# Patient Record
Sex: Male | Born: 1960 | Race: White | Hispanic: No | Marital: Married | Smoking: Never smoker
Health system: Southern US, Community
[De-identification: ages and names within clinical notes are randomized; demographics above are authoritative.]

## PROBLEM LIST (undated history)

## (undated) DIAGNOSIS — E782 Mixed hyperlipidemia: Secondary | ICD-10-CM

## (undated) DIAGNOSIS — I10 Essential (primary) hypertension: Secondary | ICD-10-CM

## (undated) DIAGNOSIS — Z9289 Personal history of other medical treatment: Secondary | ICD-10-CM

## (undated) DIAGNOSIS — K219 Gastro-esophageal reflux disease without esophagitis: Secondary | ICD-10-CM

## (undated) DIAGNOSIS — Z8711 Personal history of peptic ulcer disease: Secondary | ICD-10-CM

## (undated) DIAGNOSIS — N210 Calculus in bladder: Secondary | ICD-10-CM

## (undated) DIAGNOSIS — Z8719 Personal history of other diseases of the digestive system: Secondary | ICD-10-CM

## (undated) DIAGNOSIS — Z87442 Personal history of urinary calculi: Secondary | ICD-10-CM

## (undated) DIAGNOSIS — N200 Calculus of kidney: Secondary | ICD-10-CM

## (undated) DIAGNOSIS — J302 Other seasonal allergic rhinitis: Secondary | ICD-10-CM

## (undated) DIAGNOSIS — T7840XA Allergy, unspecified, initial encounter: Secondary | ICD-10-CM

## (undated) DIAGNOSIS — K2950 Unspecified chronic gastritis without bleeding: Secondary | ICD-10-CM

## (undated) HISTORY — PX: UPPER GASTROINTESTINAL ENDOSCOPY: SHX188

## (undated) HISTORY — PX: WRIST GANGLION EXCISION: SUR520

## (undated) HISTORY — DX: Gastro-esophageal reflux disease without esophagitis: K21.9

## (undated) HISTORY — DX: Allergy, unspecified, initial encounter: T78.40XA

## (undated) HISTORY — PX: COLONOSCOPY: SHX174

## (undated) HISTORY — PX: EYE SURGERY: SHX253

---
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
## 1998-11-10 HISTORY — PX: TYMPANOPLASTY: SHX33

## 2016-05-02 ENCOUNTER — Ambulatory Visit (INDEPENDENT_AMBULATORY_CARE_PROVIDER_SITE_OTHER): Payer: BC Managed Care – PPO | Admitting: Family Medicine

## 2016-05-02 ENCOUNTER — Encounter: Payer: Self-pay | Admitting: Family Medicine

## 2016-05-02 VITALS — BP 160/90 | HR 79 | Temp 98.4°F | Ht 72.0 in | Wt 241.0 lb

## 2016-05-02 DIAGNOSIS — K6289 Other specified diseases of anus and rectum: Secondary | ICD-10-CM | POA: Diagnosis not present

## 2016-05-02 DIAGNOSIS — IMO0001 Reserved for inherently not codable concepts without codable children: Secondary | ICD-10-CM

## 2016-05-02 DIAGNOSIS — Z1211 Encounter for screening for malignant neoplasm of colon: Secondary | ICD-10-CM | POA: Diagnosis not present

## 2016-05-02 DIAGNOSIS — Z0001 Encounter for general adult medical examination with abnormal findings: Secondary | ICD-10-CM | POA: Diagnosis not present

## 2016-05-02 DIAGNOSIS — E669 Obesity, unspecified: Secondary | ICD-10-CM

## 2016-05-02 DIAGNOSIS — R6889 Other general symptoms and signs: Secondary | ICD-10-CM

## 2016-05-02 DIAGNOSIS — R030 Elevated blood-pressure reading, without diagnosis of hypertension: Secondary | ICD-10-CM | POA: Diagnosis not present

## 2016-05-02 DIAGNOSIS — Z130 Encounter for screening for diseases of the blood and blood-forming organs and certain disorders involving the immune mechanism: Secondary | ICD-10-CM | POA: Diagnosis not present

## 2016-05-02 LAB — COMPREHENSIVE METABOLIC PANEL
ALT: 16 U/L (ref 0–53)
AST: 15 U/L (ref 0–37)
Albumin: 4.3 g/dL (ref 3.5–5.2)
Alkaline Phosphatase: 58 U/L (ref 39–117)
BUN: 15 mg/dL (ref 6–23)
CO2: 31 mEq/L (ref 19–32)
Calcium: 9.4 mg/dL (ref 8.4–10.5)
Chloride: 102 mEq/L (ref 96–112)
Creatinine, Ser: 0.97 mg/dL (ref 0.40–1.50)
GFR: 86.2 mL/min (ref >60.00)
Glucose, Bld: 85 mg/dL (ref 70–99)
Potassium: 4.4 mEq/L (ref 3.5–5.1)
Sodium: 139 mEq/L (ref 135–145)
Total Bilirubin: 0.4 mg/dL (ref 0.2–1.2)
Total Protein: 7.1 g/dL (ref 6.0–8.3)

## 2016-05-02 LAB — CBC
HCT: 39.7 % (ref 39.0–52.0)
Hemoglobin: 13.1 g/dL (ref 13.0–17.0)
MCHC: 33 g/dL (ref 30.0–36.0)
MCV: 84.4 fl (ref 78.0–100.0)
Platelets: 260 10*3/uL (ref 150.0–400.0)
RBC: 4.71 Mil/uL (ref 4.22–5.81)
RDW: 13.6 % (ref 11.5–15.5)
WBC: 10.1 10*3/uL (ref 4.0–10.5)

## 2016-05-02 LAB — LIPID PANEL
Cholesterol: 176 mg/dL (ref 0–200)
HDL: 28.8 mg/dL — ABNORMAL LOW (ref >39.00)
LDL Cholesterol: 118 mg/dL — ABNORMAL HIGH (ref 0–99)
NonHDL: 147.34
Total CHOL/HDL Ratio: 6
Triglycerides: 148 mg/dL (ref 0.0–149.0)
VLDL: 29.6 mg/dL (ref 0.0–40.0)

## 2016-05-02 LAB — HEMOGLOBIN A1C: Hgb A1c MFr Bld: 5.4 % (ref 4.6–6.5)

## 2016-05-02 MED ORDER — HYDROCORTISONE 2.5 % RE CREA: 1.0000 "application " | TOPICAL_CREAM | Freq: Two times a day (BID) | RECTAL | Status: DC

## 2016-05-02 NOTE — Patient Instructions (Signed)
Use the medication as prescribed. Use sitz baths as well. Use miralax to keep bowel movements soft (17 g once or twice a day).  We will call regarding your referral.  Follow up annually or sooner if your problem persists.  Take care  Dr. Lacinda Axon   Health Maintenance, Male A healthy lifestyle and preventative care can promote health and wellness.  Maintain regular health, dental, and eye exams.  Eat a healthy diet. Foods like vegetables, fruits, whole grains, low-fat dairy products, and lean protein foods contain the nutrients you need and are low in calories. Decrease your intake of foods high in solid fats, added sugars, and salt. Get information about a proper diet from your health care provider, if necessary.  Regular physical exercise is one of the most important things you can do for your health. Most adults should get at least 150 minutes of moderate-intensity exercise (any activity that increases your heart rate and causes you to sweat) each week. In addition, most adults need muscle-strengthening exercises on 2 or more days a week.   Maintain a healthy weight. The body mass index (BMI) is a screening tool to identify possible weight problems. It provides an estimate of body fat based on height and weight. Your health care provider can find your BMI and can help you achieve or maintain a healthy weight. For males 20 years and older:  A BMI below 18.5 is considered underweight.  A BMI of 18.5 to 24.9 is normal.  A BMI of 25 to 29.9 is considered overweight.  A BMI of 30 and above is considered obese.  Maintain normal blood lipids and cholesterol by exercising and minimizing your intake of saturated fat. Eat a balanced diet with plenty of fruits and vegetables. Blood tests for lipids and cholesterol should begin at age 45 and be repeated every 5 years. If your lipid or cholesterol levels are high, you are over age 60, or you are at high risk for heart disease, you may need your  cholesterol levels checked more frequently.Ongoing high lipid and cholesterol levels should be treated with medicines if diet and exercise are not working.  If you smoke, find out from your health care provider how to quit. If you do not use tobacco, do not start.  Lung cancer screening is recommended for adults aged 18-80 years who are at high risk for developing lung cancer because of a history of smoking. A yearly low-dose CT scan of the lungs is recommended for people who have at least a 30-pack-year history of smoking and are current smokers or have quit within the past 15 years. A pack year of smoking is smoking an average of 1 pack of cigarettes a day for 1 year (for example, a 30-pack-year history of smoking could mean smoking 1 pack a day for 30 years or 2 packs a day for 15 years). Yearly screening should continue until the smoker has stopped smoking for at least 15 years. Yearly screening should be stopped for people who develop a health problem that would prevent them from having lung cancer treatment.  If you choose to drink alcohol, do not have more than 2 drinks per day. One drink is considered to be 12 oz (360 mL) of beer, 5 oz (150 mL) of wine, or 1.5 oz (45 mL) of liquor.  Avoid the use of street drugs. Do not share needles with anyone. Ask for help if you need support or instructions about stopping the use of drugs.  High blood  pressure causes heart disease and increases the risk of stroke. High blood pressure is more likely to develop in:  People who have blood pressure in the end of the normal range (100-139/85-89 mm Hg).  People who are overweight or obese.  People who are African American.  If you are 74-9 years of age, have your blood pressure checked every 3-5 years. If you are 34 years of age or older, have your blood pressure checked every year. You should have your blood pressure measured twice--once when you are at a hospital or clinic, and once when you are not at a  hospital or clinic. Record the average of the two measurements. To check your blood pressure when you are not at a hospital or clinic, you can use:  An automated blood pressure machine at a pharmacy.  A home blood pressure monitor.  If you are 59-30 years old, ask your health care provider if you should take aspirin to prevent heart disease.  Diabetes screening involves taking a blood sample to check your fasting blood sugar level. This should be done once every 3 years after age 39 if you are at a normal weight and without risk factors for diabetes. Testing should be considered at a younger age or be carried out more frequently if you are overweight and have at least 1 risk factor for diabetes.  Colorectal cancer can be detected and often prevented. Most routine colorectal cancer screening begins at the age of 28 and continues through age 36. However, your health care provider may recommend screening at an earlier age if you have risk factors for colon cancer. On a yearly basis, your health care provider may provide home test kits to check for hidden blood in the stool. A small camera at the end of a tube may be used to directly examine the colon (sigmoidoscopy or colonoscopy) to detect the earliest forms of colorectal cancer. Talk to your health care provider about this at age 25 when routine screening begins. A direct exam of the colon should be repeated every 5-10 years through age 70, unless early forms of precancerous polyps or small growths are found.  People who are at an increased risk for hepatitis B should be screened for this virus. You are considered at high risk for hepatitis B if:  You were born in a country where hepatitis B occurs often. Talk with your health care provider about which countries are considered high risk.  Your parents were born in a high-risk country and you have not received a shot to protect against hepatitis B (hepatitis B vaccine).  You have HIV or AIDS.  You  use needles to inject street drugs.  You live with, or have sex with, someone who has hepatitis B.  You are a man who has sex with other men (MSM).  You get hemodialysis treatment.  You take certain medicines for conditions like cancer, organ transplantation, and autoimmune conditions.  Hepatitis C blood testing is recommended for all people born from [birth year] through 1963 and any individual with known risk factors for hepatitis C.  Healthy men should no longer receive prostate-specific antigen (PSA) blood tests as part of routine cancer screening. Talk to your health care provider about prostate cancer screening.  Testicular cancer screening is not recommended for adolescents or adult males who have no symptoms. Screening includes self-exam, a health care provider exam, and other screening tests. Consult with your health care provider about any symptoms you have or any concerns you  have about testicular cancer.  Practice safe sex. Use condoms and avoid high-risk sexual practices to reduce the spread of sexually transmitted infections (STIs).  You should be screened for STIs, including gonorrhea and chlamydia if:  You are sexually active and are younger than 24 years.  You are older than 24 years, and your health care provider tells you that you are at risk for this type of infection.  Your sexual activity has changed since you were last screened, and you are at an increased risk for chlamydia or gonorrhea. Ask your health care provider if you are at risk.  If you are at risk of being infected with HIV, it is recommended that you take a prescription medicine daily to prevent HIV infection. This is called pre-exposure prophylaxis (PrEP). You are considered at risk if:  You are a man who has sex with other men (MSM).  You are a heterosexual man who is sexually active with multiple partners.  You take drugs by injection.  You are sexually active with a partner who has HIV.  Talk with  your health care provider about whether you are at high risk of being infected with HIV. If you choose to begin PrEP, you should first be tested for HIV. You should then be tested every 3 months for as long as you are taking PrEP.  Use sunscreen. Apply sunscreen liberally and repeatedly throughout the day. You should seek shade when your shadow is shorter than you. Protect yourself by wearing long sleeves, pants, a wide-brimmed hat, and sunglasses year round whenever you are outdoors.  Tell your health care provider of new moles or changes in moles, especially if there is a change in shape or color. Also, tell your health care provider if a mole is larger than the size of a pencil eraser.  A one-time screening for abdominal aortic aneurysm (AAA) and surgical repair of large AAAs by ultrasound is recommended for men aged 30-75 years who are current or former smokers.  Stay current with your vaccines (immunizations).   This information is not intended to replace advice given to you by your health care provider. Make sure you discuss any questions you have with your health care provider.   Document Released: 04/24/2008 Document Revised: 11/17/2014 Document Reviewed: 03/24/2011 Elsevier Interactive Patient Education [employer].

## 2016-05-02 NOTE — Progress Notes (Signed)
Pre visit review using our clinic review tool, if applicable. No additional management support is needed unless otherwise documented below in the visit note. 

## 2016-05-04 DIAGNOSIS — Z0189 Encounter for other specified special examinations: Secondary | ICD-10-CM | POA: Insufficient documentation

## 2016-05-04 DIAGNOSIS — I10 Essential (primary) hypertension: Secondary | ICD-10-CM | POA: Insufficient documentation

## 2016-05-04 DIAGNOSIS — K6289 Other specified diseases of anus and rectum: Secondary | ICD-10-CM | POA: Insufficient documentation

## 2016-05-04 DIAGNOSIS — Z0001 Encounter for general adult medical examination with abnormal findings: Secondary | ICD-10-CM | POA: Insufficient documentation

## 2016-05-04 DIAGNOSIS — Z0000 Encounter for general adult medical examination without abnormal findings: Secondary | ICD-10-CM | POA: Insufficient documentation

## 2016-05-04 DIAGNOSIS — Z113 Encounter for screening for infections with a predominantly sexual mode of transmission: Secondary | ICD-10-CM | POA: Insufficient documentation

## 2016-05-04 DIAGNOSIS — I152 Hypertension secondary to endocrine disorders: Secondary | ICD-10-CM | POA: Insufficient documentation

## 2016-05-04 NOTE — Assessment & Plan Note (Signed)
In need of colonoscopy. Amendable. Will arrange. Screening labs today. Unsure about tetanus immunization.

## 2016-05-04 NOTE — Progress Notes (Signed)
Subjective:  Patient ID: Joseph Booth, male    DOB: 1961/09/01  Age: 55 y.o. MRN: [id number]  CC: Establish care, rectal pain.  HPI Joseph Booth is a 55 y.o. male presents to the clinic today to establish care. He also complains of rectal pain.  Preventative Healthcare  Colonoscopy: In need of.   Immunizations  Tetanus - Unsure.   Pneumococcal - Not indicated.   Flu - Not indicated at this time.  Zoster - Not indicated at this time.  Prostate cancer screening: Will discuss.  Labs: Screening labs today.   Exercise: No regular exercise.  Alcohol use: See below.  Smoking/tobacco use: Nonsmoker.   Regular dental exams: Yes.   Wears seat belt: Yes.   Rectal pain  Patient states that he's had rectal pain for the past to a half weeks.  He states that he initially improved and has now worsened again.  Pain is moderate to severe.  He's noticed some blood with wiping.  Pain is worse with defecation.  He's tried some over-the-counter Preparation H with no improvement.  No associated abdominal pain.  No weight loss.  No fevers or chills.  PMH, Surgical Hx, Family Hx, Social History reviewed and updated as below.  Past Medical History  Diagnosis Date  . Chicken pox   . Allergy    Past Surgical History  Procedure Laterality Date  . Tonsillectomy  1969  . Tympanoplasty  2000   Family History  Problem Relation Age of Onset  . Alcohol abuse Mother   . Mental illness Sister    Social History  Substance Use Topics  . Smoking status: Never Smoker   . Smokeless tobacco: Never Used  . Alcohol Use: 0.0 - 0.6 oz/week    0-1 Standard drinks or equivalent per week   Review of Systems  Gastrointestinal: Positive for diarrhea and rectal pain.  All other systems reviewed and are negative.  Objective:   Today's Vitals: BP 160/90 mmHg  Pulse 79  Temp(Src) 98.4 F (36.9 C) (Oral)  Ht 6' (1.829 m)  Wt 241 lb (109.317 kg)  BMI 32.68 kg/m2  SpO2  96%  Physical Exam  Constitutional: He is oriented to person, place, and time. He appears well-developed and well-nourished. No distress.  HENT:  Head: Normocephalic and atraumatic.  Nose: Nose normal.  Mouth/Throat: Oropharynx is clear and moist. No oropharyngeal exudate.  Normal TM's bilaterally.   Eyes: Conjunctivae are normal. No scleral icterus.  Neck: Neck supple. No thyromegaly present.  Cardiovascular: Normal rate and regular rhythm.   No murmur heard. Pulmonary/Chest: Effort normal and breath sounds normal. He has no wheezes. He has no rales.  Abdominal: Soft. He exhibits no distension. There is no tenderness. There is no rebound and no guarding.  Genitourinary:  Rectum - erythema/irritation noted. Small external hemorrhoid noted.  Musculoskeletal: Normal range of motion. He exhibits no edema.  Lymphadenopathy:    He has no cervical adenopathy.  Neurological: He is alert and oriented to person, place, and time.  Skin: Skin is warm and dry. No rash noted.  Psychiatric: He has a normal mood and affect.  Vitals reviewed.  Assessment & Plan:   Problem List Items Addressed This Visit    Encounter for preventative adult health care exam with abnormal findings - Primary    In need of colonoscopy. Amendable. Will arrange. Screening labs today. Unsure about tetanus immunization.      Elevated BP    Elevated today but improved on repeat.  Will follow.  Relevant Orders   Comp Met (CMET) (Completed)   Rectal pain    New problem. Hemorrhoid noted. Irritaion/erythema noted as well. Treating with Anusol.  Arranging colonoscopy.       Other Visit Diagnoses    Screening for deficiency anemia        Relevant Orders    CBC (Completed)    Obesity (BMI 30.0-34.9)        Relevant Orders    Lipid Profile (Completed)    HgB A1c (Completed)    Encounter for screening colonoscopy        Relevant Orders    Ambulatory referral to Gastroenterology       Outpatient  Encounter Prescriptions as of 05/02/2016  Medication Sig  . Esomeprazole Magnesium (NEXIUM PO) Take by mouth.  . hydrocortisone (ANUSOL-HC) 2.5 % rectal cream Place 1 application rectally 2 (two) times daily.  Marland Kitchen ibuprofen (ADVIL,MOTRIN) 100 MG tablet Take 100 mg by mouth every 6 (six) hours as needed for fever.   No facility-administered encounter medications on file as of 05/02/2016.    Follow-up: Annually or sooner if needed.  [facility]

## 2016-05-04 NOTE — Assessment & Plan Note (Signed)
Elevated today but improved on repeat.  Will follow.

## 2016-05-04 NOTE — Assessment & Plan Note (Signed)
New problem. Hemorrhoid noted. Irritaion/erythema noted as well. Treating with Anusol.  Arranging colonoscopy.

## 2016-05-09 ENCOUNTER — Encounter: Payer: Self-pay | Admitting: Gastroenterology

## 2016-06-25 ENCOUNTER — Ambulatory Visit (AMBULATORY_SURGERY_CENTER): Payer: Self-pay | Admitting: *Deleted

## 2016-06-25 ENCOUNTER — Encounter: Payer: Self-pay | Admitting: Gastroenterology

## 2016-06-25 VITALS — Ht 70.0 in | Wt 241.0 lb

## 2016-06-25 DIAGNOSIS — Z1211 Encounter for screening for malignant neoplasm of colon: Secondary | ICD-10-CM

## 2016-06-25 MED ORDER — NA SULFATE-K SULFATE-MG SULF 17.5-3.13-1.6 GM/177ML PO SOLN: ORAL | 0 refills | Status: DC

## 2016-06-25 NOTE — Progress Notes (Signed)
Patient denies any allergies to eggs or soy. Patient denies any problems with anesthesia/sedation. Patient denies any oxygen use at home and does not take any diet/weight loss medications.  

## 2016-07-09 ENCOUNTER — Encounter: Payer: Self-pay | Admitting: Gastroenterology

## 2016-07-09 ENCOUNTER — Ambulatory Visit (AMBULATORY_SURGERY_CENTER): Payer: BC Managed Care – PPO | Admitting: Gastroenterology

## 2016-07-09 VITALS — BP 119/83 | HR 73 | Temp 98.9°F | Resp 13 | Ht 70.0 in | Wt 241.0 lb

## 2016-07-09 DIAGNOSIS — K649 Unspecified hemorrhoids: Secondary | ICD-10-CM

## 2016-07-09 DIAGNOSIS — Z1211 Encounter for screening for malignant neoplasm of colon: Secondary | ICD-10-CM

## 2016-07-09 DIAGNOSIS — D125 Benign neoplasm of sigmoid colon: Secondary | ICD-10-CM | POA: Diagnosis not present

## 2016-07-09 DIAGNOSIS — K5730 Diverticulosis of large intestine without perforation or abscess without bleeding: Secondary | ICD-10-CM

## 2016-07-09 DIAGNOSIS — D122 Benign neoplasm of ascending colon: Secondary | ICD-10-CM

## 2016-07-09 MED ORDER — SODIUM CHLORIDE 0.9 % IV SOLN: 500.0000 mL | INTRAVENOUS | Status: DC

## 2016-07-09 NOTE — Patient Instructions (Signed)
YOU HAD AN ENDOSCOPIC PROCEDURE TODAY AT [facility]:   Refer to the procedure report that was given to you for any specific questions about what was found during the examination.  If the procedure report does not answer your questions, please call your gastroenterologist to clarify.  If you requested that your care partner not be given the details of your procedure findings, then the procedure report has been included in a sealed envelope for you to review at your convenience later.  YOU SHOULD EXPECT: Some feelings of bloating in the abdomen. Passage of more gas than usual.  Walking can help get rid of the air that was put into your GI tract during the procedure and reduce the bloating. If you had a lower endoscopy (such as a colonoscopy or flexible sigmoidoscopy) you may notice spotting of blood in your stool or on the toilet paper. If you underwent a bowel prep for your procedure, you may not have a normal bowel movement for a few days.  Please Note:  You might notice some irritation and congestion in your nose or some drainage.  This is from the oxygen used during your procedure.  There is no need for concern and it should clear up in a day or so.  SYMPTOMS TO REPORT IMMEDIATELY:   Following lower endoscopy (colonoscopy or flexible sigmoidoscopy):  Excessive amounts of blood in the stool  Significant tenderness or worsening of abdominal pains  Swelling of the abdomen that is new, acute  Fever of 100F or higher   For urgent or emergent issues, a gastroenterologist can be reached at any hour by calling [phone].   DIET:  We do recommend a small meal at first, but then you may proceed to your regular diet.  Drink plenty of fluids but you should avoid alcoholic beverages for 24 hours.  ACTIVITY:  You should plan to take it easy for the rest of today and you should NOT DRIVE or use heavy machinery until tomorrow (because of the sedation medicines used during the test).     FOLLOW UP: Our staff will call the number listed on your records the next business day following your procedure to check on you and address any questions or concerns that you may have regarding the information given to you following your procedure. If we do not reach you, we will leave a message.  However, if you are feeling well and you are not experiencing any problems, there is no need to return our call.  We will assume that you have returned to your regular daily activities without incident.  If any biopsies were taken you will be contacted by phone or by letter within the next 1-3 weeks.  Please call us at [phone] if you have not heard about the biopsies in 3 weeks.    SIGNATURES/CONFIDENTIALITY: You and/or your care partner have signed paperwork which will be entered into your electronic medical record.  These signatures attest to the fact that that the information above on your After Visit Summary has been reviewed and is understood.  Full responsibility of the confidentiality of this discharge information lies with you and/or your care-partner.  Polyps, hemorrhoids, diverticulosis, high fiber diet-handouts given  No NSAIDs or aspirin for 2 weeks.  Repeat colonoscopy will be determined by pathology.

## 2016-07-09 NOTE — Op Note (Signed)
[facility] Patient Name: Joseph Booth Procedure Date: 07/09/2016 9:12 AM MRN: [id number] Endoscopist: Milus Banister , MD Age: 55 Referring MD:  Date of Birth: [birth year] Gender: Male Account #: [id number] Procedure:                Colonoscopy Indications:              Screening for colorectal malignant neoplasm Medicines:                Monitored Anesthesia Care Procedure:                Pre-Anesthesia Assessment:                           - Prior to the procedure, a History and Physical                            was performed, and patient medications and                            allergies were reviewed. The patient's tolerance of                            previous anesthesia was also reviewed. The risks                            and benefits of the procedure and the sedation                            options and risks were discussed with the patient.                            All questions were answered, and informed consent                            was obtained. Prior Anticoagulants: The patient has                            taken no previous anticoagulant or antiplatelet                            agents. ASA Grade Assessment: II - A patient with                            mild systemic disease. After reviewing the risks                            and benefits, the patient was deemed in                            satisfactory condition to undergo the procedure.                           After obtaining informed consent, the colonoscope  was passed under direct vision. Throughout the                            procedure, the patient's blood pressure, pulse, and                            oxygen saturations were monitored continuously. The                            Model CF-HQ190L [id number]) scope was introduced                            through the anus and advanced to the the cecum,                            identified by  appendiceal orifice and ileocecal                            valve. The colonoscopy was performed without                            difficulty. The patient tolerated the procedure                            well. The quality of the bowel preparation was                            excellent. The ileocecal valve, appendiceal                            orifice, and rectum were photographed. Scope In: 9:14:34 AM Scope Out: 9:37:47 AM Scope Withdrawal Time: 0 hours 20 minutes 26 seconds  Total Procedure Duration: 0 hours 23 minutes 13 seconds  Findings:                 Three sessile polyps were found in the sigmoid                            colon and ascending colon. The polyps were 4 to 6                            mm in size. These polyps were removed with a cold                            snare. Resection and retrieval were complete.                           A 25 mm polyp was found in the sigmoid colon. The                            polyp was pedunculated. The polyp was removed with  a hot snare. Resection and retrieval were complete.                            Area was tattooed with an injection of [place] ink.                           A few small-mouthed diverticula were found in the                            left colon.                           Non-bleeding external hemorrhoids were found. The                            hemorrhoids were small. There was also a small                            posterior midline anal fisure.                           The exam was otherwise without abnormality on                            direct and retroflexion views. Complications:            No immediate complications. Estimated blood loss:                            None. Estimated Blood Loss:     Estimated blood loss: none. Impression:               - Three 4 to 6 mm polyps in the sigmoid colon and                            in the ascending colon, removed with a cold  snare.                            Resected and retrieved.                           - One 25 mm polyp in the sigmoid colon, removed                            with a hot snare. Resected and retrieved. Tattooed.                           - Diverticulosis in the left colon.                           - Non-bleeding external hemorrhoids and small anal                            fissure                           -  The examination was otherwise normal on direct                            and retroflexion views. Recommendation:           - Patient has a contact number available for                            emergencies. The signs and symptoms of potential                            delayed complications were discussed with the                            patient. Return to normal activities tomorrow.                            Written discharge instructions were provided to the                            patient.                           - Resume previous diet.                           - Continue present medications. No ASA or NSAIDs                            for 2 weeks                           - Please also start once daily fiber supplement                            (powder citrucel).                           You will receive a letter within 2-3 weeks with the                            pathology results and my final recommendations.                           If the polyp(s) is proven to be 'pre-cancerous' on                            pathology, you will need repeat colonoscopy in 3                            years. If the polyp(s) is NOT 'precancerous' on                            pathology then you should repeat colon cancer  screening in 10 years with colonoscopy without need                            for colon cancer screening by any method prior to                            then (including stool testing). Milus Banister, MD 07/09/2016 9:50:22 AM This  report has been signed electronically.

## 2016-07-09 NOTE — Progress Notes (Signed)
Called to room to assist during endoscopic procedure.  Patient ID and intended procedure confirmed with present staff. Received instructions for my participation in the procedure from the performing physician.  

## 2016-07-09 NOTE — Progress Notes (Signed)
Report to PACU, RN, vss, BBS= Clear.  

## 2016-07-10 ENCOUNTER — Telehealth: Payer: Self-pay | Admitting: *Deleted

## 2016-07-10 NOTE — Telephone Encounter (Signed)
Name identifier, left message, follow-up 

## 2016-07-17 ENCOUNTER — Encounter: Payer: Self-pay | Admitting: Gastroenterology

## 2017-05-04 ENCOUNTER — Ambulatory Visit (INDEPENDENT_AMBULATORY_CARE_PROVIDER_SITE_OTHER): Payer: BC Managed Care – PPO | Admitting: Family Medicine

## 2017-05-04 ENCOUNTER — Encounter: Payer: Self-pay | Admitting: Family Medicine

## 2017-05-04 VITALS — BP 148/82 | HR 89 | Temp 98.9°F | Resp 16 | Ht 70.75 in | Wt 234.0 lb

## 2017-05-04 DIAGNOSIS — I10 Essential (primary) hypertension: Secondary | ICD-10-CM

## 2017-05-04 DIAGNOSIS — Z0001 Encounter for general adult medical examination with abnormal findings: Secondary | ICD-10-CM

## 2017-05-04 NOTE — Patient Instructions (Signed)
Dietary changes/weight loss.  Follow up in 6 months.  Take care  Dr. Lacinda Axon    Health Maintenance, Male A healthy lifestyle and preventive care is important for your health and wellness. Ask your health care provider about what schedule of regular examinations is right for you. What should I know about weight and diet? Eat a Healthy Diet  Eat plenty of vegetables, fruits, whole grains, low-fat dairy products, and lean protein.  Do not eat a lot of foods high in solid fats, added sugars, or salt.  Maintain a Healthy Weight Regular exercise can help you achieve or maintain a healthy weight. You should:  Do at least 150 minutes of exercise each week. The exercise should increase your heart rate and make you sweat (moderate-intensity exercise).  Do strength-training exercises at least twice a week.  Watch Your Levels of Cholesterol and Blood Lipids  Have your blood tested for lipids and cholesterol every 5 years starting at 54 years of age. If you are at high risk for heart disease, you should start having your blood tested when you are 54 years old. You may need to have your cholesterol levels checked more often if: ? Your lipid or cholesterol levels are high. ? You are older than 54 years of age. ? You are at high risk for heart disease.  What should I know about cancer screening? Many types of cancers can be detected early and may often be prevented. Lung Cancer  You should be screened every year for lung cancer if: ? You are a current smoker who has smoked for at least 30 years. ? You are a former smoker who has quit within the past 15 years.  Talk to your health care provider about your screening options, when you should start screening, and how often you should be screened.  Colorectal Cancer  Routine colorectal cancer screening usually begins at 54 years of age and should be repeated every 5-10 years until you are 54 years old. You may need to be screened more often if  early forms of precancerous polyps or small growths are found. Your health care provider may recommend screening at an earlier age if you have risk factors for colon cancer.  Your health care provider may recommend using home test kits to check for hidden blood in the stool.  A small camera at the end of a tube can be used to examine your colon (sigmoidoscopy or colonoscopy). This checks for the earliest forms of colorectal cancer.  Prostate and Testicular Cancer  Depending on your age and overall health, your health care provider may do certain tests to screen for prostate and testicular cancer.  Talk to your health care provider about any symptoms or concerns you have about testicular or prostate cancer.  Skin Cancer  Check your skin from head to toe regularly.  Tell your health care provider about any new moles or changes in moles, especially if: ? There is a change in a mole's size, shape, or color. ? You have a mole that is larger than a pencil eraser.  Always use sunscreen. Apply sunscreen liberally and repeat throughout the day.  Protect yourself by wearing long sleeves, pants, a wide-brimmed hat, and sunglasses when outside.  What should I know about heart disease, diabetes, and high blood pressure?  If you are 44-52 years of age, have your blood pressure checked every 3-5 years. If you are 24 years of age or older, have your blood pressure checked every year. You  should have your blood pressure measured twice-once when you are at a hospital or clinic, and once when you are not at a hospital or clinic. Record the average of the two measurements. To check your blood pressure when you are not at a hospital or clinic, you can use: ? An automated blood pressure machine at a pharmacy. ? A home blood pressure monitor.  Talk to your health care provider about your target blood pressure.  If you are between 58-41 years old, ask your health care provider if you should take aspirin to  prevent heart disease.  Have regular diabetes screenings by checking your fasting blood sugar level. ? If you are at a normal weight and have a low risk for diabetes, have this test once every three years after the age of 2. ? If you are overweight and have a high risk for diabetes, consider being tested at a younger age or more often.  A one-time screening for abdominal aortic aneurysm (AAA) by ultrasound is recommended for men aged 65-75 years who are current or former smokers. What should I know about preventing infection? Hepatitis B If you have a higher risk for hepatitis B, you should be screened for this virus. Talk with your health care provider to find out if you are at risk for hepatitis B infection. Hepatitis C Blood testing is recommended for:  Everyone born from [birth year] through 1965.  Anyone with known risk factors for hepatitis C.  Sexually Transmitted Diseases (STDs)  You should be screened each year for STDs including gonorrhea and chlamydia if: ? You are sexually active and are younger than 54 years of age. ? You are older than 54 years of age and your health care provider tells you that you are at risk for this type of infection. ? Your sexual activity has changed since you were last screened and you are at an increased risk for chlamydia or gonorrhea. Ask your health care provider if you are at risk.  Talk with your health care provider about whether you are at high risk of being infected with HIV. Your health care provider may recommend a prescription medicine to help prevent HIV infection.  What else can I do?  Schedule regular health, dental, and eye exams.  Stay current with your vaccines (immunizations).  Do not use any tobacco products, such as cigarettes, chewing tobacco, and e-cigarettes. If you need help quitting, ask your health care provider.  Limit alcohol intake to no more than 2 drinks per day. One drink equals 12 ounces of beer, 5 ounces of wine, or  1 ounces of hard liquor.  Do not use street drugs.  Do not share needles.  Ask your health care provider for help if you need support or information about quitting drugs.  Tell your health care provider if you often feel depressed.  Tell your health care provider if you have ever been abused or do not feel safe at home. This information is not intended to replace advice given to you by your health care provider. Make sure you discuss any questions you have with your health care provider. Document Released: 04/24/2008 Document Revised: 06/25/2016 Document Reviewed: 07/31/2015 Elsevier Interactive Patient Education  [employer].

## 2017-05-04 NOTE — Progress Notes (Signed)
Subjective:  Patient ID: Joseph Booth, male    DOB: Feb 14, 1961  Age: 56 y.o. MRN: [id number]  CC: Annual physical  HPI Joseph Booth is a 56 y.o. male presents to the clinic today for an annual physical exam.  Preventative Healthcare  Colonoscopy: Up to date.  Immunizations  Tetanus - Declines.   Zoster - Declines.  Hepatitis C screening - Declines. Has had Hep B screening.  Labs: No labs today.  Alcohol use: See below.  Smoking/tobacco use: Nonsmoker.  STD/HIV testing: Has had HIV screening.  PMH, Surgical Hx, Family Hx, Social History reviewed and updated as below.  Past Medical History:  Diagnosis Date  . Allergy   . Chicken pox   . GERD (gastroesophageal reflux disease)   . Hay fever    Past Surgical History:  Procedure Laterality Date  . TONSILLECTOMY  1969  . TYMPANOPLASTY  2000  . UPPER GASTROINTESTINAL ENDOSCOPY    . WRIST GANGLION EXCISION     Family History  Problem Relation Age of Onset  . Alcohol abuse Mother   . Mental illness Sister   . Squamous cell carcinoma Father   . Colon cancer Neg Hx    Social History  Substance Use Topics  . Smoking status: Never Smoker  . Smokeless tobacco: Never Used  . Alcohol use 0.0 - 0.6 oz/week     Comment: occ.   Review of Systems General: Denies unexplained weight loss, fever. Skin: Denies new or changing mole, sore/wound that won't heal. ENT: Trouble hearing, ringing in the ears, sores in the mouth, hoarseness, trouble swallowing. Eyes: Denies trouble seeing/visual disturbance. Heart/CV: Denies chest pain, shortness of breath, edema, palpitations. Lungs/Resp: Denies cough, shortness of breath, hemoptysis. Abd/GI: Denies nausea, vomiting, diarrhea, constipation, abdominal pain, hematochezia, melena. GU: Denies dysuria, incontinence, hematuria, urinary frequency, difficulty starting/keeping stream, penile discharge, sexual difficulty, lump in testicles. MSK: Denies joint pain/swelling, myalgias. Neuro:  Denies headaches, weakness, numbness, dizziness, syncope. Psych: Denies sadness, anxiety, stress, memory difficulty. Endocrine: Denies polyuria and polydipsia.  Objective:   Today's Vitals: BP (!) 148/82   Pulse 89   Temp 98.9 F (37.2 C) (Oral)   Resp 16   Ht 5' 10.75" (1.797 m)   Wt 234 lb (106.1 kg)   SpO2 93%   BMI 32.87 kg/m   Physical Exam  Constitutional: He is oriented to person, place, and time. He appears well-developed and well-nourished. No distress.  HENT:  Head: Normocephalic and atraumatic.  Nose: Nose normal.  Mouth/Throat: Oropharynx is clear and moist. No oropharyngeal exudate.  Normal TM's bilaterally.   Eyes: Conjunctivae are normal. No scleral icterus.  Neck: Neck supple.  Cardiovascular: Normal rate and regular rhythm.   No murmur heard. Pulmonary/Chest: Effort normal and breath sounds normal. He has no wheezes. He has no rales.  Abdominal: Soft. He exhibits no distension. There is no tenderness. There is no rebound and no guarding.  Musculoskeletal: Normal range of motion. He exhibits no edema.  Lymphadenopathy:    He has no cervical adenopathy.  Neurological: He is alert and oriented to person, place, and time.  Skin: Skin is warm and dry. No rash noted.  Psychiatric: He has a normal mood and affect.  Vitals reviewed.  Assessment & Plan:   Problem List Items Addressed This Visit      Cardiovascular and Mediastinum   Essential hypertension     Other   Encounter for preventative adult health care exam with abnormal findings - Primary    BP elevated today.  Meets criteria for HTN. Elects for lifestyle change. Follow up in 6 months. Declines tetanus. Declines shingles vaccine. Colonoscopy up to date. Has had HIV screening. Hep C screening declined.        Follow-up: 6 months regarding BP  [place]

## 2017-05-04 NOTE — Assessment & Plan Note (Signed)
BP elevated today. Meets criteria for HTN. Elects for lifestyle change. Follow up in 6 months. Declines tetanus. Declines shingles vaccine. Colonoscopy up to date. Has had HIV screening. Hep C screening declined.

## 2017-06-15 ENCOUNTER — Encounter: Payer: Self-pay | Admitting: Family Medicine

## 2017-10-10 ENCOUNTER — Emergency Department
Admission: EM | Admit: 2017-10-10 | Discharge: 2017-10-10 | Disposition: A | Discharge status: Home / Self Care | Payer: BC Managed Care – PPO | Attending: Emergency Medicine | Admitting: Emergency Medicine

## 2017-10-10 DIAGNOSIS — I10 Essential (primary) hypertension: Secondary | ICD-10-CM | POA: Diagnosis not present

## 2017-10-10 DIAGNOSIS — N201 Calculus of ureter: Secondary | ICD-10-CM | POA: Insufficient documentation

## 2017-10-10 DIAGNOSIS — R1011 Right upper quadrant pain: Secondary | ICD-10-CM | POA: Diagnosis present

## 2017-10-10 LAB — URINALYSIS, COMPLETE (UACMP) WITH MICROSCOPIC
Bacteria, UA: NONE SEEN
Bilirubin Urine: NEGATIVE
Glucose, UA: NEGATIVE mg/dL
Hgb urine dipstick: NEGATIVE
Ketones, ur: NEGATIVE mg/dL
Leukocytes, UA: NEGATIVE
Nitrite: NEGATIVE
Protein, ur: NEGATIVE mg/dL
Specific Gravity, Urine: 1.017 (ref 1.005–1.030)
Squamous Epithelial / LPF: NONE SEEN
pH: 5 (ref 5.0–8.0)

## 2017-10-10 MED ORDER — IBUPROFEN 600 MG PO TABS: 600.0000 mg | ORAL_TABLET | Freq: Three times a day (TID) | ORAL | 0 refills | Status: AC | PRN

## 2017-10-10 MED ORDER — SODIUM CHLORIDE 0.9 % IV BOLUS (SEPSIS)
1000.0000 mL | Freq: Once | INTRAVENOUS | Status: AC
  Administered 2017-10-10: 1000 mL via INTRAVENOUS

## 2017-10-10 MED ORDER — OXYCODONE-ACETAMINOPHEN 5-325 MG PO TABS: 1.0000 | ORAL_TABLET | Freq: Four times a day (QID) | ORAL | 0 refills | Status: AC | PRN

## 2017-10-10 MED ORDER — KETOROLAC TROMETHAMINE 30 MG/ML IJ SOLN
30.0000 mg | Freq: Once | INTRAMUSCULAR | Status: AC
  Administered 2017-10-10: 30 mg via INTRAVENOUS

## 2017-10-10 MED ORDER — KETOROLAC TROMETHAMINE 30 MG/ML IJ SOLN
INTRAMUSCULAR | Status: AC
  Filled 2017-10-10: qty 1

## 2017-10-10 NOTE — ED Triage Notes (Signed)
Pt reports to ER with hx of kidney stones, states he has pain like that today.  Pt c/o R sided flank pain.  Pt reports throwing up PTA.  Pt appears uncomfortable in triage.

## 2017-10-10 NOTE — Discharge Instructions (Signed)
Return to the ER for new, worsening, or constant pain, pain not relieved by the prescribed medications, vomiting or inability to tolerate anything by mouth, fevers, weakness, blood in the urine, testicular pain, or any other new or worsening symptoms that concern you.  You should follow-up with a urologist.

## 2017-10-10 NOTE — ED Provider Notes (Signed)
[facility] Emergency Department Provider Note ____________________________________________   First MD Initiated Contact with Patient 10/10/17 1623     (approximate)  I have reviewed the triage vital signs and the nursing notes.   HISTORY  Chief Complaint Nephrolithiasis and Flank Pain    HPI Joseph Booth is a 56 y.o. male with past medical history as noted below, and recent diagnosis of kidney stones approximately 1 month ago who presents with right flank pain, acute onset approximately 8 hours ago, colicky, radiating around to the right mid abdomen, associated with intermittent nausea, and identical to the pain from his prior kidney stone.  Patient states he took Vicodin that was prescribed after his last episode of the kidney stone, and it did not help.  Patient denies any dysuria, hematuria, or fever.  Patient states he had a kidney stone for the first time proximal 1 month ago, while he was on a trip to [place].  He was seen in the ED, and had a CT scan done.  Patient states that the stone was deemed to be small, and passed on its own.  No other prior history of this.    Past Medical History:  Diagnosis Date  . Allergy   . Chicken pox   . GERD (gastroesophageal reflux disease)   . Hay fever   . Kidney stones     Patient Active Problem List   Diagnosis Date Noted  . Encounter for preventative adult health care exam with abnormal findings 05/04/2016  . Essential hypertension 05/04/2016    Past Surgical History:  Procedure Laterality Date  . TONSILLECTOMY  1969  . TYMPANOPLASTY  2000  . UPPER GASTROINTESTINAL ENDOSCOPY    . WRIST GANGLION EXCISION      Prior to Admission medications   Medication Sig Start Date End Date Taking? Authorizing Provider  Esomeprazole Magnesium (NEXIUM PO) Take 1 capsule by mouth as needed.    Yes [provider]  ibuprofen (ADVIL,MOTRIN) 100 MG tablet Take 100 mg by mouth every 6 (six) hours as needed for  fever.   Yes [provider]    Allergies Patient has no known allergies.  Family History  Problem Relation Age of Onset  . Alcohol abuse Mother   . Mental illness Sister   . Squamous cell carcinoma Father   . Colon cancer Neg Hx     Social History Social History   Tobacco Use  . Smoking status: Never Smoker  . Smokeless tobacco: Never Used  Substance Use Topics  . Alcohol use: Yes    Alcohol/week: 0.0 - 0.6 oz    Comment: occ.  . Drug use: No    Review of Systems  Constitutional: No fever/chills.  Eyes: No redness. ENT: No sore throat. Cardiovascular: Denies chest pain. Respiratory: Denies shortness of breath. Gastrointestinal: Positive for nausea, no vomiting.  No diarrhea.  Genitourinary: Negative for dysuria.  Musculoskeletal: Negative for back pain. Skin: Negative for rash. Neurological: Negative for headache.    ____________________________________________   PHYSICAL EXAM:  VITAL SIGNS: ED Triage Vitals  Enc Vitals Group     BP 10/10/17 1607 (!) 147/99     Pulse Rate 10/10/17 1607 69     Resp 10/10/17 1607 20     Temp 10/10/17 1607 98.3 F (36.8 C)     Temp Source 10/10/17 1607 Oral     SpO2 10/10/17 1607 99 %     Weight 10/10/17 1608 234 lb (106.1 kg)     Height --  Head Circumference --      Peak Flow --      Pain Score 10/10/17 1606 10     Pain Loc --      Pain Edu? --      Excl. in [place]? --     Constitutional: Alert and oriented.  Slightly uncomfortable appearing but in no acute distress. Eyes: Conjunctivae are normal.  Head: Atraumatic. Nose: No congestion/rhinnorhea. Mouth/Throat: Mucous membranes are moist.   Neck: Normal range of motion.  Cardiovascular:   Good peripheral circulation. Respiratory: Normal respiratory effort.   Gastrointestinal: Soft with mild right mid abdominal and flank tenderness. No distention.  Genitourinary: No CVA tenderness.  Mild right flank tenderness. Musculoskeletal: No lower extremity  edema.  Extremities warm and well perfused.  Neurologic:  Normal speech and language. No gross focal neurologic deficits are appreciated.  Skin:  Skin is warm and dry. No rash noted. Psychiatric: Mood and affect are normal. Speech and behavior are normal.  ____________________________________________   LABS (all labs ordered are listed, but only abnormal results are displayed)  Labs Reviewed  URINALYSIS, COMPLETE (UACMP) WITH MICROSCOPIC - Abnormal; Notable for the following components:      Result Value   Color, Urine YELLOW (*)    APPearance CLEAR (*)    All other components within normal limits   ____________________________________________  EKG   ____________________________________________  RADIOLOGY    ____________________________________________   PROCEDURES  Procedure(s) performed: No    Critical Care performed: No ____________________________________________   INITIAL IMPRESSION / ASSESSMENT AND PLAN / ED COURSE  Pertinent labs & imaging results that were available during my care of the patient were reviewed by me and considered in my medical decision making (see chart for details).  56 year old male with past medical history as noted above presents with right flank pain, radiating into the right side abdomen, and identical to pain from prior kidney stone which she had for the first time 1 month ago at an outside hospital.  No significant associated urinary symptoms.  Review of past medical records in epic is noncontributory.  On exam, vital signs are normal, patient is slightly uncomfortable but not acutely ill-appearing, and there is tenderness in the right flank.  Given the nature of the pain and overall clinical picture, presentation is consistent with ureteral stone.  Lower suspicion for UTI/pyelo.  I have low suspicion for other intra-abdominal cause such as diverticulitis or colitis, though given the lack of GI symptoms and the similar to patient's  prior pain from kidney stone.  Since patient was previously imaged during the last episode (when he had a first time stone), he likely will not need repeat imaging today.  We will obtain UA, give fluids and analgesia, and reassess.  Consider CT if refractory pain, or UA findings not consistent with ureteral stone.    ----------------------------------------- 7:19 PM on 10/10/2017 -----------------------------------------  Patient reports significant improvement in the pain with the Toradol.  His pain has been at a low level for the last few hours, and has not become more severe.  He has not required additional doses of analgesic.  Patient's urine did not show any significant findings; although I would expect to see more RBCs, this is not inconsistent with a stone, and there is no evidence of pyelonephritis or other complication.  Given that patient's pain is well controlled, there is no indication for imaging or further workup at this time.  Patient feels well and would like to go home.  I will prescribe  him for ibuprofen, Percocet and he states he still has Zofran for nausea from his prior episode of stone.  I discussed the results of the UA, explained discharge instructions and plan, and gave return precautions.  Patient expressed understanding.  ____________________________________________   FINAL CLINICAL IMPRESSION(S) / ED DIAGNOSES  Final diagnoses:  Ureteral stone      NEW MEDICATIONS STARTED DURING THIS VISIT:  This SmartLink is deprecated. Use AVSMEDLIST instead to display the medication list for a patient.   Note:  This document was prepared using Dragon voice recognition software and may include unintentional dictation errors.    Arta Silence, MD 10/10/17 1921

## 2017-11-04 ENCOUNTER — Ambulatory Visit: Payer: BC Managed Care – PPO | Admitting: Family Medicine

## 2017-11-04 ENCOUNTER — Encounter: Payer: Self-pay | Admitting: Family Medicine

## 2017-11-04 ENCOUNTER — Other Ambulatory Visit: Payer: Self-pay

## 2017-11-04 DIAGNOSIS — F419 Anxiety disorder, unspecified: Secondary | ICD-10-CM | POA: Diagnosis not present

## 2017-11-04 DIAGNOSIS — K219 Gastro-esophageal reflux disease without esophagitis: Secondary | ICD-10-CM | POA: Insufficient documentation

## 2017-11-04 DIAGNOSIS — I10 Essential (primary) hypertension: Secondary | ICD-10-CM | POA: Diagnosis not present

## 2017-11-04 HISTORY — DX: Anxiety disorder, unspecified: F41.9

## 2017-11-04 MED ORDER — AMLODIPINE BESYLATE 5 MG PO TABS: 5.0000 mg | ORAL_TABLET | Freq: Every day | ORAL | 3 refills | Status: DC

## 2017-11-04 NOTE — Assessment & Plan Note (Signed)
Very minimal at times.  He will monitor and if worsens will let us know.

## 2017-11-04 NOTE — Patient Instructions (Signed)
Nice to see you. Please start on the amlodipine for your blood pressure.  We will have you return in 1 month for recheck with nursing. Please send [place] your lab results.

## 2017-11-04 NOTE — Progress Notes (Signed)
  Joseph Rumps, MD Phone: [phone]  Joseph Booth is a 55 y.o. male who presents today for follow-up.  Hypertension: Blood pressure has been higher recently.  133-161/80s-102.  No chest pain or shortness of breath.  Not on any medication.  GERD: Taking Nexium over-the-counter.  He reports having had multiple EGDs in the past due to gastric ulcers.  He notes no gastric ulcer symptoms.  Notes he will take the Nexium for 3-4 days when he has symptoms and then he will be good for up to a month.  He notes he will wake up with the heartburn at times.  Anxiety: Not too much stress recently.  Home life is good.  Does note he had a little bit of anxiety while on a cruise when things felt stuffy and he had to get outside though has not had any significant anxiety since.  Social History   Tobacco Use  Smoking Status Never Smoker  Smokeless Tobacco Never Used     ROS see history of present illness  Objective  Physical Exam Vitals:   11/04/17 1104  BP: 132/88  Pulse: 74  Temp: 98.3 F (36.8 C)  SpO2: 94%    BP Readings from Last 3 Encounters:  11/04/17 132/88  10/10/17 (!) 146/98  05/04/17 (!) 148/82   Wt Readings from Last 3 Encounters:  11/04/17 237 lb 3.2 oz (107.6 kg)  10/10/17 234 lb (106.1 kg)  05/04/17 234 lb (106.1 kg)    Physical Exam  Constitutional: No distress.  Cardiovascular: Normal rate, regular rhythm and normal heart sounds.  Pulmonary/Chest: Effort normal and breath sounds normal.  Abdominal: Soft. Bowel sounds are normal. He exhibits no distension. There is no tenderness.  Musculoskeletal: He exhibits no edema.  Neurological: He is alert. Gait normal.  Skin: Skin is warm and dry. He is not diaphoretic.     Assessment/Plan: Please see individual problem list.  Essential hypertension BP intermittently elevated at home.  We will start on amlodipine.  He will follow-up in 1 month for recheck of blood pressure.  He will send [place] his recent lab  work.  GERD (gastroesophageal reflux disease) Occasional symptoms that respond to intermittent Nexium.  Benign exam today.  He will continue Nexium as needed.  If symptoms worsen or he develops new symptoms he will be reevaluated.  Anxiety Very minimal at times.  He will monitor and if worsens will let us know.   Joseph Booth was seen today for establish care.  Diagnoses and all orders for this visit:  Essential hypertension  Gastroesophageal reflux disease, esophagitis presence not specified  Anxiety  Other orders -     amLODipine (NORVASC) 5 MG tablet; Take 1 tablet (5 mg total) by mouth daily.    No orders of the defined types were placed in this encounter.   Meds ordered this encounter  Medications  . amLODipine (NORVASC) 5 MG tablet    Sig: Take 1 tablet (5 mg total) by mouth daily.    Dispense:  90 tablet    Refill:  [place], MD [place]

## 2017-11-04 NOTE — Assessment & Plan Note (Signed)
Occasional symptoms that respond to intermittent Nexium.  Benign exam today.  He will continue Nexium as needed.  If symptoms worsen or he develops new symptoms he will be reevaluated.

## 2017-11-04 NOTE — Assessment & Plan Note (Signed)
BP intermittently elevated at home.  We will start on amlodipine.  He will follow-up in 1 month for recheck of blood pressure.  He will send [place] his recent lab work.

## 2017-12-09 ENCOUNTER — Ambulatory Visit (INDEPENDENT_AMBULATORY_CARE_PROVIDER_SITE_OTHER): Payer: BC Managed Care – PPO

## 2017-12-09 VITALS — BP 136/82 | HR 84

## 2017-12-09 DIAGNOSIS — I10 Essential (primary) hypertension: Secondary | ICD-10-CM | POA: Diagnosis not present

## 2017-12-09 NOTE — Progress Notes (Signed)
Patient comes in today for a blood pressure check. Patient had a follow up appointment with PCP on 11/04/2017 and was started on amlodipine 5 mg. Today his blood pressure in left arm is 136/82 and pulse is 84. Patient states he is taking amlodipine daily and is not having any side effects or symptoms.

## 2017-12-11 NOTE — Progress Notes (Signed)
The patient's blood pressure is relatively stable from his office visit at that time.  I would suggest increasing to amlodipine 10 mg daily and rechecking in 1 month.

## 2017-12-15 ENCOUNTER — Telehealth: Payer: Self-pay

## 2017-12-15 NOTE — Telephone Encounter (Signed)
Joseph Haven, MD at 12/09/2017 9:00 AM   Status: Signed    The patient's blood pressure is relatively stable from his office visit at that time.  I would suggest increasing to amlodipine 10 mg daily and rechecking in 1 month.     Patient returned call, message above given per Dr. Caryl Booth, patient verbalized understanding and asked can he just take 2 of the 5mg  amlodipine he has, I advised yes and to call the office when refills are needed, he verbalized understanding.

## 2017-12-15 NOTE — Telephone Encounter (Signed)
-----   Message from Leone Haven, MD sent at 12/11/2017  5:59 PM EST -----   ----- Message ----- From: Juanda Chance, CMA Sent: 12/09/2017   8:46 AM To: Leone Haven, MD

## 2017-12-15 NOTE — Progress Notes (Signed)
Left message to return call to inform patient his bp at nurse visit is relatively stable from his office visit, DR.Caryl Bis would like patient to increaser amlodipine to 10 mg daily and rechecking bp in 1 month, ok for pec to inform patient and schedule nurse visit for 1 month

## 2017-12-16 NOTE — Progress Notes (Signed)
pec informed patient

## 2018-01-12 ENCOUNTER — Telehealth: Payer: Self-pay | Admitting: *Deleted

## 2018-01-12 DIAGNOSIS — Z87442 Personal history of urinary calculi: Secondary | ICD-10-CM

## 2018-01-12 NOTE — Telephone Encounter (Signed)
Patient tried to call to schedule appointment at [facility] urology and they informed him that he will need a referral. Patient denies any symptoms at this time but he has a history of kidney stones.

## 2018-01-12 NOTE — Telephone Encounter (Signed)
Copied from [place] [id number]. Topic: Referral - Request >> Jan 12, 2018  3:17 PM Neva Seat wrote: Referral [facility] - multiple kidney stones - as been in hospital due to them.

## 2018-01-13 NOTE — Telephone Encounter (Signed)
Referral placed.

## 2018-01-18 ENCOUNTER — Telehealth: Payer: Self-pay | Admitting: Family Medicine

## 2018-01-18 MED ORDER — AMLODIPINE BESYLATE 10 MG PO TABS: 10.0000 mg | ORAL_TABLET | Freq: Every day | ORAL | 3 refills | Status: DC

## 2018-01-18 NOTE — Addendum Note (Signed)
Addended by: Leone Haven on: 01/18/2018 06:04 PM   Modules accepted: Orders

## 2018-01-18 NOTE — Telephone Encounter (Signed)
This was increased per 12/15/17 telephone encounmter. Please send in new dose

## 2018-01-18 NOTE — Telephone Encounter (Signed)
Copied from [place] [phone]. Topic: General - Other >> Jan 18, 2018  1:18 PM Oneta Rack wrote: Relation to pt: self  Call back number: [phone] Pharmacy: [facility], [place] [phone] (Phone) [phone] (Fax)    Reason for call:  Patient requesting amLODipine [facility]) patient states MG increased to 10 MG, patient denied contacting the pharmacy due to [facility] changing, please advise patient states he's completley out >> Jan 18, 2018  1:21 PM Oneta Rack wrote: Relation to pt: self  Call back number: [phone] Pharmacy: [place], [place] - [place] [phone] (Phone) [phone] (Fax)    Reason for call:  Patient requesting amLODipine ([place]). Patient states MG increased to 10 MG, patient denied contacting the pharmacy due to [facility] changing,  patient states he's completley out, please advise

## 2018-01-18 NOTE — Telephone Encounter (Signed)
Sent to pharmacy.  Patient needs BP recheck.  Thanks.

## 2018-01-19 NOTE — Telephone Encounter (Signed)
Patient is scheduled   

## 2018-01-20 ENCOUNTER — Ambulatory Visit: Payer: BC Managed Care – PPO

## 2018-01-20 ENCOUNTER — Telehealth: Payer: Self-pay | Admitting: Family Medicine

## 2018-01-20 NOTE — Telephone Encounter (Signed)
Pt called in to cancel apt. While on the line pt mentioned to me that he has been without his BP medication for a few days. Pt says that he has been taking his last Rx, which is 5 MG, pt says that he has been taking 2 to total 10 MG. Pt says that he hasnt noticed a change in his BP with taking medication. Pt says that he will call back in to schedule an apt with provider to discuss changing his BP medication. I did make pt aware that his Rx for 10 MG has been sent in to pharmacy on 01/18/18, and to pick up.   Pt says that he will, he wasn't aware that it was at the pharmacy.

## 2018-01-21 ENCOUNTER — Ambulatory Visit: Payer: BC Managed Care – PPO

## 2018-01-21 NOTE — Telephone Encounter (Signed)
fyi

## 2018-01-21 NOTE — Telephone Encounter (Signed)
Noted  

## 2018-02-03 ENCOUNTER — Telehealth: Payer: Self-pay

## 2018-02-03 ENCOUNTER — Telehealth: Payer: Self-pay | Admitting: Family Medicine

## 2018-02-03 ENCOUNTER — Ambulatory Visit: Payer: Self-pay

## 2018-02-03 ENCOUNTER — Emergency Department
Admission: EM | Admit: 2018-02-03 | Discharge: 2018-02-03 | Disposition: A | Discharge status: Home / Self Care | Payer: BC Managed Care – PPO | Attending: Student in an Organized Health Care Education/Training Program | Admitting: Student in an Organized Health Care Education/Training Program

## 2018-02-03 ENCOUNTER — Emergency Department: Payer: BC Managed Care – PPO

## 2018-02-03 ENCOUNTER — Other Ambulatory Visit: Payer: Self-pay

## 2018-02-03 DIAGNOSIS — I10 Essential (primary) hypertension: Secondary | ICD-10-CM | POA: Insufficient documentation

## 2018-02-03 DIAGNOSIS — Z85828 Personal history of other malignant neoplasm of skin: Secondary | ICD-10-CM | POA: Insufficient documentation

## 2018-02-03 DIAGNOSIS — Z85038 Personal history of other malignant neoplasm of large intestine: Secondary | ICD-10-CM | POA: Insufficient documentation

## 2018-02-03 DIAGNOSIS — Z79899 Other long term (current) drug therapy: Secondary | ICD-10-CM | POA: Diagnosis not present

## 2018-02-03 DIAGNOSIS — R079 Chest pain, unspecified: Secondary | ICD-10-CM

## 2018-02-03 DIAGNOSIS — R0789 Other chest pain: Secondary | ICD-10-CM | POA: Diagnosis not present

## 2018-02-03 HISTORY — DX: Essential (primary) hypertension: I10

## 2018-02-03 LAB — BASIC METABOLIC PANEL
Anion gap: 9 (ref 5–15)
BUN: 22 mg/dL — ABNORMAL HIGH (ref 6–20)
CO2: 26 mmol/L (ref 22–32)
Calcium: 9.3 mg/dL (ref 8.9–10.3)
Chloride: 104 mmol/L (ref 101–111)
Creatinine, Ser: 1.07 mg/dL (ref 0.61–1.24)
GFR calc Af Amer: >60 mL/min (ref >60)
GFR calc non Af Amer: >60 mL/min (ref >60)
Glucose, Bld: 128 mg/dL — ABNORMAL HIGH (ref 65–99)
Potassium: 3.7 mmol/L (ref 3.5–5.1)
Sodium: 139 mmol/L (ref 135–145)

## 2018-02-03 LAB — CBC
HCT: 38.2 % — ABNORMAL LOW (ref 40.0–52.0)
Hemoglobin: 13.1 g/dL (ref 13.0–18.0)
MCH: 30.2 pg (ref 26.0–34.0)
MCHC: 34.3 g/dL (ref 32.0–36.0)
MCV: 88 fL (ref 80.0–100.0)
Platelets: 231 10*3/uL (ref 150–440)
RBC: 4.35 MIL/uL — ABNORMAL LOW (ref 4.40–5.90)
RDW: 14.7 % — ABNORMAL HIGH (ref 11.5–14.5)
WBC: 6.7 10*3/uL (ref 3.8–10.6)

## 2018-02-03 LAB — FIBRIN DERIVATIVES D-DIMER (ARMC ONLY): Fibrin derivatives D-dimer (ARMC): 194.37 ng/mL (FEU) (ref 0.00–499.00)

## 2018-02-03 LAB — TROPONIN I
Troponin I: <0.03 ng/mL (ref <0.03)
Troponin I: <0.03 ng/mL (ref <0.03)

## 2018-02-03 MED ORDER — GI COCKTAIL ~~LOC~~
30.0000 mL | Freq: Once | ORAL | Status: AC
  Administered 2018-02-03: 30 mL via ORAL
  Filled 2018-02-03: qty 30

## 2018-02-03 MED ORDER — HYDROCODONE-ACETAMINOPHEN 5-325 MG PO TABS
1.0000 | ORAL_TABLET | Freq: Once | ORAL | Status: AC
  Administered 2018-02-03: 1 via ORAL
  Filled 2018-02-03: qty 1

## 2018-02-03 NOTE — ED Notes (Signed)
Pt states that he has been having mid chest pain for the past 3 days with periods of shortness of breath - he also states that he is having bilat lower leg swelling - at this time pt appears in NAD - respirations are even and unlabored

## 2018-02-03 NOTE — ED Provider Notes (Signed)
[facility] Emergency Department Provider Note    First MD Initiated Contact with Patient 02/03/18 1856     (approximate)  I have reviewed the triage vital signs and the nursing notes.   HISTORY  Chief Complaint Chest Pain    HPI Joseph Booth is a 57 y.o. male with a history of GERD and hypertension presents with chief complaint of 3 days of midsternal nonradiating chest pain he did once get worse when he was walking up his steep driveway.  No associated diaphoresis.  States the pain is mild and not as severe as previous episodes of kidney stones.  States it feels somewhat like previous episodes of heartburn and reflux.  Denies any pain when taking a deep breath.  Has noted bilateral lower extremity swelling but was recently started on Norvasc.  Past Medical History:  Diagnosis Date  . Allergy   . Chicken pox   . GERD (gastroesophageal reflux disease)   . Hay fever   . Hypertension   . Kidney stones    Family History  Problem Relation Age of Onset  . Alcohol abuse Mother   . Mental illness Sister   . Squamous cell carcinoma Father   . Colon cancer Neg Hx    Past Surgical History:  Procedure Laterality Date  . TONSILLECTOMY  1969  . TYMPANOPLASTY  2000  . UPPER GASTROINTESTINAL ENDOSCOPY    . WRIST GANGLION EXCISION     Patient Active Problem List   Diagnosis Date Noted  . GERD (gastroesophageal reflux disease) 11/04/2017  . Anxiety 11/04/2017  . Encounter for preventative adult health care exam with abnormal findings 05/04/2016  . Essential hypertension 05/04/2016      Prior to Admission medications   Medication Sig Start Date End Date Taking? Authorizing Provider  amLODipine (NORVASC) 10 MG tablet Take 1 tablet (10 mg total) by mouth daily. 01/18/18  Yes Leone Haven, MD  Esomeprazole Magnesium (NEXIUM PO) Take 1 capsule by mouth as needed.     [provider]    Allergies Patient has no known allergies.    Social  History Social History   Tobacco Use  . Smoking status: Never Smoker  . Smokeless tobacco: Never Used  Substance Use Topics  . Alcohol use: Yes    Alcohol/week: 0.0 - 0.6 oz    Comment: occ.  . Drug use: No    Review of Systems Patient denies headaches, rhinorrhea, blurry vision, numbness, shortness of breath, chest pain, edema, cough, abdominal pain, nausea, vomiting, diarrhea, dysuria, fevers, rashes or hallucinations unless otherwise stated above in HPI. ____________________________________________   PHYSICAL EXAM:  VITAL SIGNS: Vitals:   02/03/18 1930 02/03/18 2000  BP: (!) 128/94 133/85  Pulse: 79 76  Resp: 19 10  Temp:    SpO2: 93% 93%    Constitutional: Alert and oriented. Well appearing and in no acute distress. Eyes: Conjunctivae are normal.  Head: Atraumatic. Nose: No congestion/rhinnorhea. Mouth/Throat: Mucous membranes are moist.   Neck: No stridor. Painless ROM.  Cardiovascular: Normal rate, regular rhythm. Grossly normal heart sounds.  Good peripheral circulation. Respiratory: Normal respiratory effort.  No retractions. Lungs CTAB. Gastrointestinal: Soft and nontender. No distention. No abdominal bruits. No CVA tenderness. Genitourinary:  Musculoskeletal: No lower extremity tenderness trace bilateral pedal edema.  No joint effusions. Neurologic:  Normal speech and language. No gross focal neurologic deficits are appreciated. No facial droop Skin:  Skin is warm, dry and intact. No rash noted. Psychiatric: Mood and affect are normal.  Speech and behavior are normal.  ____________________________________________   LABS (all labs ordered are listed, but only abnormal results are displayed)  Results for orders placed or performed during the hospital encounter of 02/03/18 (from the past 24 hour(s))  Basic metabolic panel     Status: Abnormal   Collection Time: 02/03/18  6:28 PM  Result Value Ref Range   Sodium 139 135 - 145 mmol/L   Potassium 3.7 3.5 -  5.1 mmol/L   Chloride 104 101 - 111 mmol/L   CO2 26 22 - 32 mmol/L   Glucose, Bld 128 (H) 65 - 99 mg/dL   BUN 22 (H) 6 - 20 mg/dL   Creatinine, Ser 1.07 0.61 - 1.24 mg/dL   Calcium 9.3 8.9 - 10.3 mg/dL   GFR calc non Af Amer >60 >60 mL/min   GFR calc Af Amer >60 >60 mL/min   Anion gap 9 5 - 15  CBC     Status: Abnormal   Collection Time: 02/03/18  6:28 PM  Result Value Ref Range   WBC 6.7 3.8 - 10.6 K/uL   RBC 4.35 (L) 4.40 - 5.90 MIL/uL   Hemoglobin 13.1 13.0 - 18.0 g/dL   HCT 38.2 (L) 40.0 - 52.0 %   MCV 88.0 80.0 - 100.0 fL   MCH 30.2 26.0 - 34.0 pg   MCHC 34.3 32.0 - 36.0 g/dL   RDW 14.7 (H) 11.5 - 14.5 %   Platelets 231 150 - 440 K/uL  Troponin I     Status: None   Collection Time: 02/03/18  6:28 PM  Result Value Ref Range   Troponin I <0.03 <0.03 ng/mL  Fibrin derivatives D-Dimer ([facility] only)     Status: None   Collection Time: 02/03/18  7:27 PM  Result Value Ref Range   Fibrin derivatives D-dimer ([facility]) 194.37 0.00 - 499.00 ng/mL (FEU)   ____________________________________________  EKG My review and personal interpretation at Time: 18:26   Indication: chest pain  Rate: 80  Rhythm: sinus Axis: normal Other: normal intervals, non specific t wave abnormality, no stemi ____________________________________________  RADIOLOGY  I personally reviewed all radiographic images ordered to evaluate for the above acute complaints and reviewed radiology reports and findings.  These findings were personally discussed with the patient.  Please see medical record for radiology report.  ____________________________________________   PROCEDURES  Procedure(s) performed:  Procedures    Critical Care performed: no ____________________________________________   INITIAL IMPRESSION / ASSESSMENT AND PLAN / ED COURSE  Pertinent labs & imaging results that were available during my care of the patient were reviewed by me and considered in my medical decision making (see chart for  details).  DDX: ACS, pericarditis, esophagitis, boerhaaves, pe, dissection, pna, bronchitis, costochondritis   Joseph Booth is a 57 y.o. who presents to the ED with several days of chest pain as described above.  Patient low risk by Wells criteria therefore d-dimer was ordered to evaluate further stratify for PE.  D-dimer is negative.  Not clinically consistent with dissection.  Initial troponin is negative.  No evidence of congestive heart failure pneumothorax or consolidation.  Possible esophagitis or reflux given his history of GERD.  Will trial GI cocktail.  Clinical Course as of Feb 03 2050  Wed Feb 03, 2018  2047 Patient reassessed.  Plan will be to get a repeat troponin for 3-hour delta as the patient does have a heart score of 3 versus 4 based on subjectivity to further risk stratify.  Anticipate discharge home.  Patient will be given referral to cardiology.  Have discussed with the patient and available family all diagnostics and treatments performed thus far and all questions were answered to the best of my ability. The patient demonstrates understanding and agreement with plan.    [PR]    Clinical Course User Index [PR] Merlyn Lot, MD     As part of my medical decision making, I reviewed the following data within the [facility] notes reviewed and incorporated, Labs reviewed, notes from prior ED visits and [place] Controlled Substance Database   ____________________________________________   FINAL CLINICAL IMPRESSION(S) / ED DIAGNOSES  Final diagnoses:  Nonspecific chest pain      NEW MEDICATIONS STARTED DURING THIS VISIT:  New Prescriptions   No medications on file     Note:  This document was prepared using Dragon voice recognition software and may include unintentional dictation errors.    Merlyn Lot, MD 02/03/18 2052

## 2018-02-03 NOTE — Telephone Encounter (Signed)
Called patient and left voicemail for him to call the office back.

## 2018-02-03 NOTE — Telephone Encounter (Signed)
Looks like he called back after 5:00 and was sent to the ED by the [facility].  Please follow-up with him on Thursday regarding his evaluation.  Thanks.

## 2018-02-03 NOTE — Telephone Encounter (Signed)
We are unable to reach him per phone

## 2018-02-03 NOTE — ED Triage Notes (Signed)
Pt c/o constant central chest tightness with SOB and LE swelling since Monday. Denies N/V/diaphoresis

## 2018-02-03 NOTE — Telephone Encounter (Signed)
-----   Message from [place], Generic sent at 02/03/2018 2:32 PM EDT -----    I tried calling the office but was on hold for a while. I am still having the issue with my chest...but I have had these before and at that time they were caused by ulcers.Marland KitchenMarland KitchenMarland KitchenI don't know about this time. I have not had any tightness in my chest and this pain does not affect my breathing at this time. The swelling of my ankles is most prominent towards the end of the day. This morning when I woke up they seemed OK. As the day has progressed I have noticed that it seems as though my socks are cutting into me( and I am wearing somewhat looser socks today). I don't have access to my cell phone during work.   ----- Message -----  From: CMA Ivory Broad F  Sent: 02/03/2018 8:57 AM EDT  To: [name]  Subject: RE: Appointment Request  Good morning Mr. Hayes,    I wanted to touch basis with you and see how you're feeling this morning. The staff at our office has tried to contact you via phone and wanted to make sure that you are okay given your symptoms. Please contact the office if you are not feeling better or go to the local ED if you feel that your symptoms are getting worse. Thank you and I hope you feel better.    ----- Message -----   From: [name]   Sent: 02/02/2018 7:35 PM EDT    To: Patient Appointment Schedule Request Mailing List  Subject: Appointment Request    Appointment Request From: [name]    With Provider: Tommi Rumps, MD [facility] [place]]    Preferred Date Range: 02/03/2018 - 02/09/2018    Preferred Times: Any time    Reason for visit: Request an Appointment    Comments:  Having swelling in the ankles and pain in my chest. For the past two days.

## 2018-02-03 NOTE — Telephone Encounter (Signed)
Second attempt made ot contact patient .

## 2018-02-03 NOTE — Telephone Encounter (Signed)
Third attempt made top contact patient.

## 2018-02-03 NOTE — Telephone Encounter (Signed)
Patient sent My chart request for an appointment for ankle swelling and chest pain X 2 days, tried to contact patient no answer left message to call office . PEC please triage patient.

## 2018-02-03 NOTE — ED Provider Notes (Signed)
This patient was signed out to me by Dr. Merlyn Lot.  57 year old male with chest pain.  The patient continues to be hemodynamically stable and is asymptomatic at this time.  His troponin has been negative x2.  At this time the patient is stable for discharge and will follow up with his primary care physician and cardiology.   Eula Listen, MD 02/03/18 2232

## 2018-02-03 NOTE — Telephone Encounter (Signed)
Pt. Called to report mid-chest pain, that is non-radiating.  Reported it started 3 days ago; is more pronounced with activity. Reported he took the trash out on Monday, and has a very long drive, and noted the chest pain on way back to house.  Described the pain as an aching that occurs mid-chest and just right of mid-chest, and at end of sternum.  Denied shortness of breath, nausea, vomiting, sweating, or dizziness.  Stated it also feels "like a really bad acid reflux."  Has taken Esomeprazole without any relief.  Also, reported swelling in bilateral ankle and lower legs up to calf.  Reported noting the swelling in his lower legs worsen as day progresses; this is a "new symptom."  Reported the only stress he has is with driving to and from [place] daily.  Advised to go to the ER for eval. of chest pain.  Pt. stated he is driving home from [place] at present, but agreed to go to [facility] ER.  Strongly advised if symptoms worsen, to pull over and call 911.  Agreed with plan.         Reason for Disposition . Chest pain lasts > 5 minutes (Exceptions: chest pain occurring > 3 days ago and now asymptomatic; same as previously diagnosed heartburn and has accompanying sour taste in mouth)  Answer Assessment - Initial Assessment Questions 1. LOCATION: "Where does it hurt?"       Mid chest; "more pronounced with activity" 2. RADIATION: "Does the pain go anywhere else?" (e.g., into neck, jaw, arms, back)     nonradiating 3. ONSET: "When did the chest pain begin?" (Minutes, hours or days)      3 days 4. PATTERN "Does the pain come and go, or has it been constant since it started?"  "Does it get worse with exertion?"      Constant 5. DURATION: "How long does it last" (e.g., seconds, minutes, hours)    Constant 6. SEVERITY: "How bad is the pain?"  (e.g., Scale 1-10; mild, moderate, or severe)    - MILD (1-3): doesn't interfere with normal activities     - MODERATE (4-7): interferes with normal  activities or awakens from sleep    - SEVERE (8-10): excruciating pain, unable to do any normal activities            "4-5/10 today; 7/10 yesterday"  7. CARDIAC RISK FACTORS: "Do you have any history of heart problems or risk factors for heart disease?" (e.g., prior heart attack, angina; high blood pressure, diabetes, being overweight, high cholesterol, smoking, or strong family history of heart disease)     No heart history / eval. on heart monitor x 1 mo; reported checked out okay.   8. PULMONARY RISK FACTORS: "Do you have any history of lung disease?"  (e.g., blood clots in lung, asthma, emphysema, birth control pills)     No  9. CAUSE: "What do you think is causing the chest pain?"     Since the hx of ulcers, has a very bad acid reflux; takes Nexium 10. OTHER SYMPTOMS: "Do you have any other symptoms?" (e.g., dizziness, nausea, vomiting, sweating, fever, difficulty breathing, cough)      Denies nausea, vomiting, sweating , shortness of breath, or dizziness 11. PREGNANCY: "Is there any chance you are pregnant?" "When was your last menstrual period?"       N/A  Protocols used: CHEST PAIN-A-AH

## 2018-02-04 DIAGNOSIS — R0609 Other forms of dyspnea: Secondary | ICD-10-CM | POA: Insufficient documentation

## 2018-02-04 DIAGNOSIS — R079 Chest pain, unspecified: Secondary | ICD-10-CM | POA: Insufficient documentation

## 2018-02-04 NOTE — Telephone Encounter (Signed)
Noted. We should plan for follow-up with the patient in the office in the next several weeks. He should keep his appointment with cardiology.

## 2018-02-04 NOTE — Telephone Encounter (Signed)
Patient notified

## 2018-02-04 NOTE — Telephone Encounter (Signed)
Patient went to ED for evaluation.

## 2018-02-04 NOTE — Telephone Encounter (Signed)
Patient states he went to the ER and they were able to rule out a heart attack at this time but was scheduled with [facility] cardiology today at 2 pm for a consult to possibly do a stress stress.Patient states he also has an appointment with GI in [facility] April 12th.

## 2018-02-08 DIAGNOSIS — K2950 Unspecified chronic gastritis without bleeding: Secondary | ICD-10-CM

## 2018-02-08 HISTORY — DX: Unspecified chronic gastritis without bleeding: K29.50

## 2018-02-10 ENCOUNTER — Encounter: Payer: Self-pay | Admitting: Urology

## 2018-02-10 ENCOUNTER — Ambulatory Visit: Payer: BC Managed Care – PPO | Admitting: Urology

## 2018-02-10 VITALS — BP 145/79 | HR 91 | Resp 16 | Ht 71.0 in | Wt 237.4 lb

## 2018-02-10 DIAGNOSIS — N210 Calculus in bladder: Secondary | ICD-10-CM

## 2018-02-10 DIAGNOSIS — N1330 Unspecified hydronephrosis: Secondary | ICD-10-CM

## 2018-02-10 LAB — URINALYSIS, COMPLETE
Bilirubin, UA: NEGATIVE
Glucose, UA: NEGATIVE
Ketones, UA: NEGATIVE
Leukocytes, UA: NEGATIVE
Nitrite, UA: NEGATIVE
RBC, UA: NEGATIVE
Specific Gravity, UA: >1.03 — ABNORMAL HIGH (ref 1.005–1.030)
Urobilinogen, Ur: 0.2 mg/dL (ref 0.2–1.0)
pH, UA: 6 (ref 5.0–7.5)

## 2018-02-10 LAB — MICROSCOPIC EXAMINATION
Bacteria, UA: NONE SEEN
Epithelial Cells (non renal): NONE SEEN /hpf (ref 0–10)
WBC, UA: NONE SEEN /hpf (ref 0–5)

## 2018-02-10 NOTE — Progress Notes (Signed)
02/10/2018 9:11 AM   Joseph Booth [birth year] [id number]  Referring provider: Leone Haven, MD [address] [place], [place] [address]  Chief complaint: Kidney stones  HPI: Joseph Booth is a 57 year old male who presented to an emergency department in [place] on 09/01/2017 with acute onset of right flank pain which radiated to the right side/right lower quadrant.  The pain was described as severe without identifiable precipitating or alleviating factors.  His pain was worse with voiding.  He had nausea/vomiting but denied fever or chills.  A stone protocol CT was ordered which showed a 12 mm irregular shaped calcification in the bladder just distal to the right UVJ however there was noted to be right hydronephrosis/hydroureter and no other calcifications identified.  It was felt he most likely had recently passed this stone.  His symptoms resolved and he was not further evaluated however presented to the ED on 10/10/2017 with an 8-hour history of right renal colic and nausea similar to his previous presenting pain.  Additional imaging was not performed and he was treated with parenteral analgesics and discharged on oral analgesics.  His severe pain resolved however since that time he has had intermittent right flank pain.  He is not aware of passing a stone.  There is no previous history of stone disease or urologic problems.   PMH: Past Medical History:  Diagnosis Date  . Allergy   . Chicken pox   . GERD (gastroesophageal reflux disease)   . Hay fever   . Hypertension   . Kidney stones     Surgical History: Past Surgical History:  Procedure Laterality Date  . TONSILLECTOMY  1969  . TYMPANOPLASTY  2000  . UPPER GASTROINTESTINAL ENDOSCOPY    . WRIST GANGLION EXCISION      Home Medications:  Allergies as of 02/10/2018   No Known Allergies     Medication List        Accurate as of 02/10/18  9:11 AM. Always use your most recent med list.          amLODipine 10  MG tablet Commonly known as:  NORVASC Take 1 tablet (10 mg total) by mouth daily.   HYDROcodone-acetaminophen 5-325 MG tablet Commonly known as:  NORCO/VICODIN Take by mouth.   NEXIUM PO Take 1 capsule by mouth as needed.   nitroGLYCERIN 0.4 MG SL tablet Commonly known as:  NITROSTAT   ondansetron 4 MG disintegrating tablet Commonly known as:  ZOFRAN-ODT Take by mouth.       Allergies: No Known Allergies  Family History: Family History  Problem Relation Age of Onset  . Alcohol abuse Mother   . Mental illness Sister   . Squamous cell carcinoma Father   . Colon cancer Neg Hx     Social History:  reports that he has never smoked. He has never used smokeless tobacco. He reports that he drinks alcohol. He reports that he does not use drugs.  ROS: UROLOGY Frequent Urination?: Yes Hard to postpone urination?: No Burning/pain with urination?: No Get up at night to urinate?: Yes Leakage of urine?: No Urine stream starts and stops?: No Trouble starting stream?: No Do you have to strain to urinate?: No Blood in urine?: No Urinary tract infection?: No Sexually transmitted disease?: No Injury to kidneys or bladder?: No Painful intercourse?: No Weak stream?: No Erection problems?: No Penile pain?: No  Gastrointestinal Nausea?: No Vomiting?: No Indigestion/heartburn?: Yes Diarrhea?: Yes Constipation?: No  Constitutional Fever: No Night sweats?: No Weight loss?:  No Fatigue?: No  Skin Skin rash/lesions?: No Itching?: No  Eyes Blurred vision?: No Double vision?: No  Ears/Nose/Throat Sore throat?: No Sinus problems?: Yes  Hematologic/Lymphatic Swollen glands?: No Easy bruising?: No  Cardiovascular Leg swelling?: No Chest pain?: Yes  Respiratory Cough?: No Shortness of breath?: No  Endocrine Excessive thirst?: No  Musculoskeletal Back pain?: No Joint pain?: No  Neurological Headaches?: No Dizziness?: No  Psychologic Depression?:  No Anxiety?: No  Physical Exam: BP (!) 145/79   Pulse 91   Resp 16   Ht 5\' 11"  (1.803 m)   Wt 237 lb 6.4 oz (107.7 kg)   SpO2 98%   BMI 33.11 kg/m   Constitutional:  Alert and oriented, No acute distress. HEENT: [place] AT, moist mucus membranes.  Trachea midline, no masses. Cardiovascular: No clubbing, cyanosis, or edema. Respiratory: Normal respiratory effort, no increased work of breathing. GI: Abdomen is soft, nontender, nondistended, no abdominal masses GU: No CVA tenderness Lymph: No cervical or inguinal lymphadenopathy. Skin: No rashes, bruises or suspicious lesions. Neurologic: Grossly intact, no focal deficits, moving all 4 extremities. Psychiatric: Normal mood and affect.  Laboratory Data: Lab Results  Component Value Date   WBC 6.7 02/03/2018   HGB 13.1 02/03/2018   HCT 38.2 (L) 02/03/2018   MCV 88.0 02/03/2018   PLT 231 02/03/2018    Lab Results  Component Value Date   CREATININE 1.07 02/03/2018    Lab Results  Component Value Date   HGBA1C 5.4 05/02/2016    Urinalysis Dipstick-trace protein Microscopy-unremarkable  Pertinent Imaging: CT images were not available for review  Assessment & Plan:   57 year old male with intermittent right flank pain and prior CT showing right hydronephrosis/hydroureter and a 12 mm calculus in the bladder.  He continues to have intermittent right flank pain.  The CT images were not available for review.  Discussed possibility that the stone was still in the distal ureter.  Will schedule a follow-up KUB and renal ultrasound and discuss management options at that time.   Abbie Sons, [facility] [address], [place], [place] [address] [phone]

## 2018-02-19 ENCOUNTER — Ambulatory Visit
Admission: RE | Admit: 2018-02-19 | Discharge: 2018-02-19 | Disposition: A | Discharge status: Home / Self Care | Payer: BC Managed Care – PPO | Source: Ambulatory Visit | Attending: Urology | Admitting: Urology

## 2018-02-19 ENCOUNTER — Ambulatory Visit: Payer: BC Managed Care – PPO | Admitting: Physician Assistant

## 2018-02-19 ENCOUNTER — Encounter: Payer: Self-pay | Admitting: Physician Assistant

## 2018-02-19 VITALS — BP 120/72 | HR 80 | Ht 71.0 in | Wt 238.6 lb

## 2018-02-19 DIAGNOSIS — R0789 Other chest pain: Secondary | ICD-10-CM | POA: Diagnosis not present

## 2018-02-19 DIAGNOSIS — N210 Calculus in bladder: Secondary | ICD-10-CM | POA: Insufficient documentation

## 2018-02-19 DIAGNOSIS — K219 Gastro-esophageal reflux disease without esophagitis: Secondary | ICD-10-CM

## 2018-02-19 DIAGNOSIS — Z8711 Personal history of peptic ulcer disease: Secondary | ICD-10-CM

## 2018-02-19 DIAGNOSIS — N200 Calculus of kidney: Secondary | ICD-10-CM | POA: Insufficient documentation

## 2018-02-19 DIAGNOSIS — N1330 Unspecified hydronephrosis: Secondary | ICD-10-CM

## 2018-02-19 DIAGNOSIS — Z8719 Personal history of other diseases of the digestive system: Secondary | ICD-10-CM | POA: Diagnosis not present

## 2018-02-19 MED ORDER — ESOMEPRAZOLE MAGNESIUM 40 MG PO CPDR: DELAYED_RELEASE_CAPSULE | ORAL | 3 refills | Status: DC

## 2018-02-19 NOTE — Patient Instructions (Signed)
If you are age 55 or older, your body mass index should be between 23-30. Your Body mass index is 33.28 kg/m. If this is out of the aforementioned range listed, please consider follow up with your Primary Care Provider.  If you are age 62 or younger, your body mass index should be between 19-25. Your Body mass index is 33.28 kg/m. If this is out of the aformentioned range listed, please consider follow up with your Primary Care Provider.   You have been scheduled for an endoscopy. Please follow written instructions given to you at your visit today. If you use inhalers (even only as needed), please bring them with you on the day of your procedure. Your physician has requested that you go to www.startemmi.com and enter the access code given to you at your visit today. This web site gives a general overview about your procedure. However, you should still follow specific instructions given to you by our office regarding your preparation for the procedure.  We have sent the following medications to your pharmacy for you to pick up at your convenience: Esomeprazole   Thank you for choosing me and [facility].   Ellouise Newer, PA-C

## 2018-02-19 NOTE — Progress Notes (Signed)
Chief Complaint: Atypical chest pain  HPI:    Joseph Booth is a 57 year old male with a past medical history of reflux and hypertension, who follows with Dr. Ardis Hughs, and who was referred to me by Leone Haven, MD for a complaint of atypical chest pain.      07/09/16 colonoscopy 4-6 mm polyps in sigmoid and descending colon, one 25 mm polyp in the sigmoid colon, diverticulosis, nonbleeding external hemorrhoids and small anal scheduled otherwise normal exam.  Polyp was adenomatous.  Repeat recommended in 3 years.    02/03/18 ED visit for midsternal nonradiating chest pain.  Cardiac enzymes negative/normal x2.  02/12/18 cardiac stress test normal.    Today, explains that he has a history of 5 previous gastric and esophageal ulcers all about 10 years ago in [place].  Restarted with severe episodes of reflux and chest pain at the end of March.  Sent him to the ER as above.  He has since had a full cardiac workup and everything is negative/normal.  They did increase his Esomeprazole from 20-40 mg daily and this has helped some but just last Sunday the patient had a severe episode of this chest pain again which made him leave church.  Patient wonders if it is related to activity/stress.  He denies any acute changes in his diet or use of NSAIDs.    Describes long history of reflux for which the patient has been using esomeprazole 20 mg for about a week at a time when he experiences them, which occur at least once a month or more.    Denies fever, chills, weight loss, anorexia, nausea, vomiting, melena, abdominal pain or symptoms that awaken him at night.  Past Medical History:  Diagnosis Date  . Allergy   . Chicken pox   . GERD (gastroesophageal reflux disease)   . Hay fever   . Hypertension   . Kidney stones     Past Surgical History:  Procedure Laterality Date  . TONSILLECTOMY  1969  . TYMPANOPLASTY  2000  . UPPER GASTROINTESTINAL ENDOSCOPY    . WRIST GANGLION EXCISION      Current Outpatient  Medications  Medication Sig Dispense Refill  . amLODipine (NORVASC) 10 MG tablet Take 1 tablet (10 mg total) by mouth daily. 90 tablet 3  . Esomeprazole Magnesium (NEXIUM PO) Take 1 capsule by mouth as needed.     Marland Kitchen HYDROcodone-acetaminophen (NORCO/VICODIN) 5-325 MG tablet Take by mouth.    . nitroGLYCERIN (NITROSTAT) 0.4 MG SL tablet   11  . ondansetron (ZOFRAN-ODT) 4 MG disintegrating tablet Take by mouth.     No current facility-administered medications for this visit.     Allergies as of 02/19/2018  . (No Known Allergies)    Family History  Problem Relation Age of Onset  . Alcohol abuse Mother   . Mental illness Sister   . Squamous cell carcinoma Father   . Colon cancer Neg Hx     Social History   Socioeconomic History  . Marital status: Married    Spouse name: Not on file  . Number of children: 2  . Years of education: 73  . Highest education level: Not on file  Occupational History  . Occupation: Sales executive  Social Needs  . Financial resource strain: Not on file  . Food insecurity:    Worry: Not on file    Inability: Not on file  . Transportation needs:    Medical: Not on file    Non-medical: Not on  file  Tobacco Use  . Smoking status: Never Smoker  . Smokeless tobacco: Never Used  Substance and Sexual Activity  . Alcohol use: Yes    Alcohol/week: 0.0 - 0.6 oz    Comment: occ.  . Drug use: No  . Sexual activity: Not on file  Lifestyle  . Physical activity:    Days per week: Not on file    Minutes per session: Not on file  . Stress: Not on file  Relationships  . Social connections:    Talks on phone: Not on file    Gets together: Not on file    Attends religious service: Not on file    Active member of club or organization: Not on file    Attends meetings of clubs or organizations: Not on file    Relationship status: Not on file  . Intimate partner violence:    Fear of current or ex partner: Not on file    Emotionally abused: Not on file     Physically abused: Not on file    Forced sexual activity: Not on file  Other Topics Concern  . Not on file  Social History Narrative  . Not on file    Review of Systems:    Constitutional: No weight loss, fever or chills Cardiovascular: +atypical chest pain Respiratory: No SOB  Gastrointestinal: See HPI and otherwise negative   Physical Exam:  Vital signs: BP 120/72   Pulse 80   Ht 5\' 11"  (1.803 m)   Wt 238 lb 9.6 oz (108.2 kg)   BMI 33.28 kg/m   Constitutional:   Pleasant Caucasian male appears to be in NAD, Well developed, Well nourished, alert and cooperative Respiratory: Respirations even and unlabored. Lungs clear to auscultation bilaterally.   No wheezes, crackles, or rhonchi.  Cardiovascular: Normal S1, S2. No MRG. Regular rate and rhythm. No peripheral edema, cyanosis or pallor.  Gastrointestinal:  Soft, nondistended, nontender. No rebound or guarding. Normal bowel sounds. No appreciable masses or hepatomegaly. Psychiatric: Demonstrates good judgement and reason without abnormal affect or behaviors.  MOST RECENT LABS AND IMAGING: CBC    Component Value Date/Time   WBC 6.7 02/03/2018 1828   RBC 4.35 (L) 02/03/2018 1828   HGB 13.1 02/03/2018 1828   HCT 38.2 (L) 02/03/2018 1828   PLT 231 02/03/2018 1828   MCV 88.0 02/03/2018 1828   MCH 30.2 02/03/2018 1828   MCHC 34.3 02/03/2018 1828   RDW 14.7 (H) 02/03/2018 1828    CMP     Component Value Date/Time   NA 139 02/03/2018 1828   K 3.7 02/03/2018 1828   CL 104 02/03/2018 1828   CO2 26 02/03/2018 1828   GLUCOSE 128 (H) 02/03/2018 1828   BUN 22 (H) 02/03/2018 1828   CREATININE 1.07 02/03/2018 1828   CALCIUM 9.3 02/03/2018 1828   PROT 7.1 05/02/2016 1414   ALBUMIN 4.3 05/02/2016 1414   AST 15 05/02/2016 1414   ALT 16 05/02/2016 1414   ALKPHOS 58 05/02/2016 1414   BILITOT 0.4 05/02/2016 1414   GFRNONAA >60 02/03/2018 1828   GFRAA >60 02/03/2018 1828    Assessment: 1.  Atypical chest pain: Multiple  episodes over the past month, some relief with Nexium 40 mg daily, cardiac workup negative; consider PUD vs gastritis vs GERD 2.  GERD: Continues with occasional episodes irregardless of Nexium 40 mg g 3.  History of gastric ulcers: 10 years ago in [place], no reports available  Plan: 1.  Scheduled patient for an EGD  in the [facility] with Dr. Ardis Hughs.  Did discuss risk, benefits, limitations and alternatives and the patient agrees to proceed. 2.  Prescribed Esomeprazole 40 mg once daily, 30-60 minutes before eating breakfast #30 with 2 refills.  Explained that we could increase this from there and he may need to stay on this medication long-term instead of using prn given risk for gastric cancer 3.  Reviewed antireflux diet and lifestyle modifications. 4.  Patient to follow in clinic per recommendations from Dr. Ardis Hughs after time of procedure.  Ellouise Newer, PA-C [facility] 02/19/2018, 8:42 AM  Cc: Leone Haven, MD

## 2018-02-19 NOTE — Progress Notes (Signed)
I agree with the above note, plan 

## 2018-02-22 ENCOUNTER — Telehealth: Payer: Self-pay | Admitting: Radiology

## 2018-02-22 NOTE — Telephone Encounter (Signed)
Does pt need to see you pre-op?  Will need orders please.

## 2018-02-22 NOTE — Telephone Encounter (Signed)
-----   Message from Abbie Sons, MD sent at 02/21/2018 12:05 PM EDT ----- KUB and renal ultrasound showed a persistent bladder calculus.  His right kidney blockage has resolved.  Recommend scheduling cystolitholapaxy.

## 2018-02-23 ENCOUNTER — Encounter: Payer: Self-pay | Admitting: Urology

## 2018-02-23 ENCOUNTER — Other Ambulatory Visit: Payer: Self-pay | Admitting: Radiology

## 2018-02-23 ENCOUNTER — Ambulatory Visit: Payer: BC Managed Care – PPO | Admitting: Urology

## 2018-02-23 VITALS — BP 122/83 | HR 86 | Resp 16 | Ht 71.0 in | Wt 237.6 lb

## 2018-02-23 DIAGNOSIS — N200 Calculus of kidney: Secondary | ICD-10-CM | POA: Diagnosis not present

## 2018-02-23 DIAGNOSIS — N210 Calculus in bladder: Secondary | ICD-10-CM

## 2018-02-23 NOTE — Progress Notes (Signed)
02/23/2018 1:50 PM   Joseph Booth [birth year] [id number]  Referring provider: Leone Haven, MD [address] [place], [place] [address]  Chief Complaint  Patient presents with  . Follow-up    HPI: 57 year old male presents for follow-up of recent imaging studies.  He was initially seen on 02/10/2018 after an episode of renal colic in October 2841 while in [place].  A CT showed a 12 mm bladder calcification distal to the right UVJ with right hydronephrosis and hydroureter.  At our initial visit a KUB and renal ultrasound was ordered.  He continues to be intermittently symptomatic with irritative voiding symptoms and low back pain.  KUB and renal ultrasound showed a 17 mm bladder calculus.  Ultrasound showed resolution of his hydronephrosis.  He has a nonobstructing mid to lower pole right renal calculus.   PMH: Past Medical History:  Diagnosis Date  . Allergy   . Chicken pox   . GERD (gastroesophageal reflux disease)   . Hay fever   . Hypertension   . Kidney stones     Surgical History: Past Surgical History:  Procedure Laterality Date  . TONSILLECTOMY  1969  . TYMPANOPLASTY  2000  . UPPER GASTROINTESTINAL ENDOSCOPY    . WRIST GANGLION EXCISION      Home Medications:  Allergies as of 02/23/2018   No Known Allergies     Medication List        Accurate as of 02/23/18  1:50 PM. Always use your most recent med list.          amLODipine 10 MG tablet Commonly known as:  NORVASC Take 1 tablet (10 mg total) by mouth daily.   esomeprazole 40 MG capsule Commonly known as:  NEXIUM Take 30-60 minutes before breakfast       Allergies: No Known Allergies  Family History: Family History  Problem Relation Age of Onset  . Alcohol abuse Mother   . Mental illness Sister   . Squamous cell carcinoma Father   . Colon cancer Neg Hx     Social History:  reports that he has never smoked. He has never used smokeless tobacco. He reports that he drinks alcohol.  He reports that he does not use drugs.  ROS: UROLOGY Frequent Urination?: No Hard to postpone urination?: No Burning/pain with urination?: No Get up at night to urinate?: Yes Leakage of urine?: No Urine stream starts and stops?: No Trouble starting stream?: No Do you have to strain to urinate?: No Blood in urine?: No Urinary tract infection?: No Sexually transmitted disease?: No Injury to kidneys or bladder?: No Painful intercourse?: No Weak stream?: No Erection problems?: No Penile pain?: No  Gastrointestinal Nausea?: No Vomiting?: No Indigestion/heartburn?: No Diarrhea?: No Constipation?: No  Constitutional Fever: No Night sweats?: No Weight loss?: No Fatigue?: No  Skin Skin rash/lesions?: No Itching?: No  Eyes Blurred vision?: No Double vision?: No  Ears/Nose/Throat Sore throat?: No Sinus problems?: No  Hematologic/Lymphatic Swollen glands?: No Easy bruising?: No  Cardiovascular Leg swelling?: No Chest pain?: No  Respiratory Cough?: No Shortness of breath?: No  Endocrine Excessive thirst?: No  Musculoskeletal Back pain?: No Joint pain?: No  Neurological Headaches?: No Dizziness?: No  Psychologic Depression?: No Anxiety?: No  Physical Exam: BP 122/83   Pulse 86   Resp 16   Ht 5\' 11"  (1.803 m)   Wt 237 lb 9.6 oz (107.8 kg)   SpO2 99%   BMI 33.14 kg/m   Constitutional:  Alert and oriented, No acute  distress. HEENT: [place] AT, moist mucus membranes.  Trachea midline, no masses. Cardiovascular: No clubbing, cyanosis, or edema. Respiratory: Normal respiratory effort, no increased work of breathing. GI: Abdomen is soft, nontender, nondistended, no abdominal masses GU: No CVA tenderness Lymph: No cervical or inguinal lymphadenopathy. Skin: No rashes, bruises or suspicious lesions. Neurologic: Grossly intact, no focal deficits, moving all 4 extremities. Psychiatric: Normal mood and affect.  Laboratory Data: Lab Results  Component  Value Date   WBC 6.7 02/03/2018   HGB 13.1 02/03/2018   HCT 38.2 (L) 02/03/2018   MCV 88.0 02/03/2018   PLT 231 02/03/2018    Lab Results  Component Value Date   CREATININE 1.07 02/03/2018    No results found for: PSA  No results found for: TESTOSTERONE  Lab Results  Component Value Date   HGBA1C 5.4 05/02/2016    Urinalysis    Component Value Date/Time   COLORURINE YELLOW (A) 10/10/2017 1606   APPEARANCEUR Clear 02/10/2018 0853   LABSPEC 1.017 10/10/2017 1606   PHURINE 5.0 10/10/2017 1606   GLUCOSEU Negative 02/10/2018 0853   HGBUR NEGATIVE 10/10/2017 1606   BILIRUBINUR Negative 02/10/2018 0853   KETONESUR NEGATIVE 10/10/2017 1606   PROTEINUR Trace (A) 02/10/2018 0853   PROTEINUR NEGATIVE 10/10/2017 1606   NITRITE Negative 02/10/2018 0853   NITRITE NEGATIVE 10/10/2017 1606   LEUKOCYTESUR Negative 02/10/2018 0853    Lab Results  Component Value Date   LABMICR See below: 02/10/2018   WBCUA None seen 02/10/2018   RBCUA 0-2 02/10/2018   LABEPIT None seen 02/10/2018   MUCUS Present (A) 02/10/2018   BACTERIA None seen 02/10/2018    Pertinent Imaging: Images were personally reviewed Results for orders placed during the hospital encounter of 02/19/18  Abdomen 1 view (KUB)   Narrative CLINICAL DATA:  Right-sided flank pain for 2 weeks  EXAM: ABDOMEN - 1 VIEW  COMPARISON:  None.  FINDINGS: Bladder calculus is noted similar to that seen on recent ultrasound examination. The stone seen on recent ultrasound in the right kidney is not well appreciated. No obstructive changes are seen within the bowel. No acute bony abnormality is noted.  IMPRESSION: Bladder calculus similar to that seen on recent ultrasound. Known right renal stone is not well appreciated on this exam.   Electronically Signed   By: Inez Catalina M.D.   On: 02/19/2018 15:50     Results for orders placed during the hospital encounter of 02/19/18  Ultrasound renal complete   Narrative  CLINICAL DATA:  No known calculi  EXAM: RENAL / URINARY TRACT ULTRASOUND COMPLETE  COMPARISON:  None.  FINDINGS: Right Kidney:  Length: 12.2 cm. No obstructive changes are noted. A 8.2 mm stone is noted in the mid to lower pole of the right kidney.  Left Kidney:  Length: 12.2 cm. No obstructive changes are noted. There is a 1.3 cm hypoechoic lesion noted in the upper pole. This likely represents a mildly complicated cyst. No other focal abnormality is noted.  Bladder:  Bladder is well distended. A bladder calculus is noted measuring 1.7 cm.  IMPRESSION: No evidence of hydronephrosis.  Nonobstructing right renal stone as well as bladder calculus.  Cystic lesion within the left kidney measuring 1.3 cm. Short-term follow-up in 6 months is recommended to assess for stability.   Electronically Signed   By: Inez Catalina M.D.   On: 02/19/2018 15:49     Assessment & Plan:   He has had resolution of his hydronephrosis however a persistent calculus within the  bladder.  He has a nonobstructing right renal calculus.  I recommended scheduling cystolitholapaxy.  The procedure was discussed including potential risks of bleeding, infection, urethral stricture and rarely bladder injury.  His history of right hydronephrosis that has resolved is consistent with a passed calculus.  He indicated all questions were answered and desires to schedule.    Abbie Sons, [place] [address], [place], [place] [address] [phone]

## 2018-02-24 ENCOUNTER — Ambulatory Visit (AMBULATORY_SURGERY_CENTER): Payer: BC Managed Care – PPO | Admitting: Gastroenterology

## 2018-02-24 ENCOUNTER — Other Ambulatory Visit: Payer: Self-pay

## 2018-02-24 ENCOUNTER — Encounter: Payer: Self-pay | Admitting: Gastroenterology

## 2018-02-24 VITALS — BP 117/83 | HR 71 | Temp 99.1°F | Resp 11 | Ht 71.0 in | Wt 238.0 lb

## 2018-02-24 DIAGNOSIS — K2950 Unspecified chronic gastritis without bleeding: Secondary | ICD-10-CM | POA: Diagnosis not present

## 2018-02-24 DIAGNOSIS — K2970 Gastritis, unspecified, without bleeding: Secondary | ICD-10-CM | POA: Diagnosis not present

## 2018-02-24 DIAGNOSIS — K219 Gastro-esophageal reflux disease without esophagitis: Secondary | ICD-10-CM

## 2018-02-24 DIAGNOSIS — K2990 Gastroduodenitis, unspecified, without bleeding: Secondary | ICD-10-CM

## 2018-02-24 MED ORDER — SODIUM CHLORIDE 0.9 % IV SOLN: 500.0000 mL | Freq: Once | INTRAVENOUS | Status: DC

## 2018-02-24 NOTE — Patient Instructions (Signed)
YOU HAD AN ENDOSCOPIC PROCEDURE TODAY AT THE [facility]:   Refer to the procedure report that was given to you for any specific questions about what was found during the examination.  If the procedure report does not answer your questions, please call your gastroenterologist to clarify.  If you requested that your care partner not be given the details of your procedure findings, then the procedure report has been included in a sealed envelope for you to review at your convenience later.  YOU SHOULD EXPECT: Some feelings of bloating in the abdomen. Passage of more gas than usual.  Walking can help get rid of the air that was put into your GI tract during the procedure and reduce the bloating.   Please Note:  You might notice some irritation and congestion in your nose or some drainage.  This is from the oxygen used during your procedure.  There is no need for concern and it should clear up in a day or so.  SYMPTOMS TO REPORT IMMEDIATELY:    Following upper endoscopy (EGD)  Vomiting of blood or coffee ground material  New chest pain or pain under the shoulder blades  Painful or persistently difficult swallowing  New shortness of breath  Fever of 100F or higher  Black, tarry-looking stools  For urgent or emergent issues, a gastroenterologist can be reached at any hour by calling [phone].   DIET:  We do recommend a small meal at first, but then you may proceed to your regular diet.  Drink plenty of fluids but you should avoid alcoholic beverages for 24 hours.  ACTIVITY:  You should plan to take it easy for the rest of today and you should NOT DRIVE or use heavy machinery until tomorrow (because of the sedation medicines used during the test).    FOLLOW UP: Our staff will call the number listed on your records the next business day following your procedure to check on you and address any questions or concerns that you may have regarding the information given to you  following your procedure. If we do not reach you, we will leave a message.  However, if you are feeling well and you are not experiencing any problems, there is no need to return our call.  We will assume that you have returned to your regular daily activities without incident.  If any biopsies were taken you will be contacted by phone or by letter within the next 1-3 weeks.  Please call us at [phone] if you have not heard about the biopsies in 3 weeks.    SIGNATURES/CONFIDENTIALITY: You and/or your care partner have signed paperwork which will be entered into your electronic medical record.  These signatures attest to the fact that that the information above on your After Visit Summary has been reviewed and is understood.  Full responsibility of the confidentiality of this discharge information lies with you and/or your care-partner.  Read all handouts given to you by your recovery room nurse. 

## 2018-02-24 NOTE — Progress Notes (Signed)
To recovery, report to RN, VSS. 

## 2018-02-24 NOTE — Progress Notes (Signed)
Called to room to assist during endoscopic procedure.  Patient ID and intended procedure confirmed with present staff. Received instructions for my participation in the procedure from the performing physician.  

## 2018-02-24 NOTE — Op Note (Signed)
[facility] Patient Name: Joseph Booth Procedure Date: 02/24/2018 9:45 AM MRN: [id number] Endoscopist: Milus Banister , MD Age: 57 Referring MD:  Date of Birth: October 22, 1961 Gender: Male Account #: [id number] Procedure:                Upper GI endoscopy Indications:              Chest pain (non cardiac) Medicines:                Monitored Anesthesia Care Procedure:                Pre-Anesthesia Assessment:                           - Prior to the procedure, a History and Physical                            was performed, and patient medications and                            allergies were reviewed. The patient's tolerance of                            previous anesthesia was also reviewed. The risks                            and benefits of the procedure and the sedation                            options and risks were discussed with the patient.                            All questions were answered, and informed consent                            was obtained. Prior Anticoagulants: The patient has                            taken no previous anticoagulant or antiplatelet                            agents. ASA Grade Assessment: II - A patient with                            mild systemic disease. After reviewing the risks                            and benefits, the patient was deemed in                            satisfactory condition to undergo the procedure.                           After obtaining informed consent, the endoscope was  passed under direct vision. Throughout the                            procedure, the patient's blood pressure, pulse, and                            oxygen saturations were monitored continuously. The                            Model GIF-[id number]) scope was introduced                            through the mouth, and advanced to the second part                            of duodenum. The upper GI  endoscopy was                            accomplished without difficulty. The patient                            tolerated the procedure well. Scope In: Scope Out: Findings:                 Minimal inflammation characterized by erythema was                            found in the gastric antrum. Biopsies were taken                            with a cold forceps for histology.                           The exam was otherwise without abnormality. Complications:            No immediate complications. Estimated blood loss:                            None. Estimated Blood Loss:     Estimated blood loss: none. Impression:               - Mild gastritis. Biopsied.                           - The examination was otherwise normal. Recommendation:           - Patient has a contact number available for                            emergencies. The signs and symptoms of potential                            delayed complications were discussed with the                            patient. Return to normal activities tomorrow.  Written discharge instructions were provided to the                            patient.                           - Resume previous diet.                           - Continue present medications. Stay on                            esomeprazole once daily for now.                           - Await pathology results. Milus Banister, MD 02/24/2018 9:58:02 AM This report has been signed electronically.

## 2018-02-25 ENCOUNTER — Telehealth: Payer: Self-pay | Admitting: *Deleted

## 2018-02-25 NOTE — Telephone Encounter (Signed)
  Follow up Call-  Call back number 02/24/2018 07/09/2016  Post procedure Call Back phone  # [phone]  Permission to leave phone message Yes Yes     Patient questions:  Message left to call us if necessary.

## 2018-02-25 NOTE — Telephone Encounter (Signed)
No answer, message left for the patient. 

## 2018-03-04 ENCOUNTER — Encounter: Payer: Self-pay | Admitting: Family Medicine

## 2018-03-04 DIAGNOSIS — N200 Calculus of kidney: Secondary | ICD-10-CM

## 2018-03-05 ENCOUNTER — Other Ambulatory Visit: Payer: Self-pay | Admitting: Family Medicine

## 2018-03-05 DIAGNOSIS — N200 Calculus of kidney: Secondary | ICD-10-CM

## 2018-03-05 NOTE — Progress Notes (Signed)
.  uro

## 2018-03-08 ENCOUNTER — Encounter: Payer: Self-pay | Admitting: Gastroenterology

## 2018-03-08 NOTE — Telephone Encounter (Signed)
Pt states he would like to cancel cystolitholapaxy scheduled with Dr Bernardo Heater on 03/23/2018. He is seeing a different provider.

## 2018-03-09 NOTE — Telephone Encounter (Signed)
Noted  

## 2018-03-17 ENCOUNTER — Other Ambulatory Visit: Payer: BC Managed Care – PPO

## 2018-03-23 ENCOUNTER — Ambulatory Visit: Admit: 2018-03-23 | Payer: BC Managed Care – PPO | Admitting: Urology

## 2018-03-23 SURGERY — CYSTOSCOPY, WITH BLADDER CALCULUS LITHOLAPAXY
Anesthesia: Choice

## 2018-04-28 ENCOUNTER — Ambulatory Visit: Payer: BC Managed Care – PPO | Admitting: Urology

## 2018-07-02 ENCOUNTER — Other Ambulatory Visit: Payer: Self-pay | Admitting: Urology

## 2018-07-09 ENCOUNTER — Other Ambulatory Visit: Payer: Self-pay

## 2018-07-09 ENCOUNTER — Encounter (HOSPITAL_BASED_OUTPATIENT_CLINIC_OR_DEPARTMENT_OTHER): Payer: Self-pay | Admitting: *Deleted

## 2018-07-09 NOTE — Progress Notes (Signed)
Spoke w/ pt via phone for pre-op interview.  Npo after mn w/ exception clear liquids until 0700 (no cream/milk products).  Arrive at 1100.  Needs istat. Current ekg in chart and epic.  Will take am meds w sips of water.

## 2018-07-14 ENCOUNTER — Encounter (HOSPITAL_BASED_OUTPATIENT_CLINIC_OR_DEPARTMENT_OTHER)
Admission: RE | Disposition: A | Discharge status: Home / Self Care | Payer: Self-pay | Source: Ambulatory Visit | Attending: Urology

## 2018-07-14 ENCOUNTER — Ambulatory Visit (HOSPITAL_BASED_OUTPATIENT_CLINIC_OR_DEPARTMENT_OTHER): Payer: BC Managed Care – PPO | Admitting: Anesthesiology

## 2018-07-14 ENCOUNTER — Encounter (HOSPITAL_BASED_OUTPATIENT_CLINIC_OR_DEPARTMENT_OTHER): Payer: Self-pay | Admitting: *Deleted

## 2018-07-14 ENCOUNTER — Ambulatory Visit (HOSPITAL_BASED_OUTPATIENT_CLINIC_OR_DEPARTMENT_OTHER)
Admission: RE | Admit: 2018-07-14 | Discharge: 2018-07-14 | Disposition: A | Discharge status: Home / Self Care | Payer: BC Managed Care – PPO | Source: Ambulatory Visit | Attending: Urology | Admitting: Urology

## 2018-07-14 ENCOUNTER — Other Ambulatory Visit: Payer: Self-pay

## 2018-07-14 DIAGNOSIS — K219 Gastro-esophageal reflux disease without esophagitis: Secondary | ICD-10-CM | POA: Diagnosis not present

## 2018-07-14 DIAGNOSIS — Z87442 Personal history of urinary calculi: Secondary | ICD-10-CM | POA: Insufficient documentation

## 2018-07-14 DIAGNOSIS — Z6832 Body mass index (BMI) 32.0-32.9, adult: Secondary | ICD-10-CM | POA: Insufficient documentation

## 2018-07-14 DIAGNOSIS — I10 Essential (primary) hypertension: Secondary | ICD-10-CM | POA: Diagnosis not present

## 2018-07-14 DIAGNOSIS — N201 Calculus of ureter: Secondary | ICD-10-CM | POA: Diagnosis not present

## 2018-07-14 DIAGNOSIS — E669 Obesity, unspecified: Secondary | ICD-10-CM | POA: Diagnosis not present

## 2018-07-14 DIAGNOSIS — E782 Mixed hyperlipidemia: Secondary | ICD-10-CM | POA: Insufficient documentation

## 2018-07-14 DIAGNOSIS — N210 Calculus in bladder: Secondary | ICD-10-CM | POA: Diagnosis present

## 2018-07-14 DIAGNOSIS — F419 Anxiety disorder, unspecified: Secondary | ICD-10-CM | POA: Diagnosis not present

## 2018-07-14 HISTORY — DX: Calculus in bladder: N21.0

## 2018-07-14 HISTORY — PX: CYSTOSCOPY WITH LITHOLAPAXY: SHX1425

## 2018-07-14 HISTORY — DX: Calculus of kidney: N20.0

## 2018-07-14 HISTORY — DX: Unspecified chronic gastritis without bleeding: K29.50

## 2018-07-14 HISTORY — DX: Personal history of urinary calculi: Z87.442

## 2018-07-14 HISTORY — DX: Personal history of other medical treatment: Z92.89

## 2018-07-14 HISTORY — DX: Personal history of peptic ulcer disease: Z87.11

## 2018-07-14 HISTORY — DX: Mixed hyperlipidemia: E78.2

## 2018-07-14 HISTORY — DX: Personal history of other diseases of the digestive system: Z87.19

## 2018-07-14 LAB — POCT I-STAT 4, (NA,K, GLUC, HGB,HCT)
Glucose, Bld: 110 mg/dL — ABNORMAL HIGH (ref 70–99)
HCT: 27 % — ABNORMAL LOW (ref 39.0–52.0)
Hemoglobin: 9.2 g/dL — ABNORMAL LOW (ref 13.0–17.0)
Potassium: 4.9 mmol/L (ref 3.5–5.1)
Sodium: 141 mmol/L (ref 135–145)

## 2018-07-14 SURGERY — CYSTOSCOPY, WITH BLADDER CALCULUS LITHOLAPAXY
Anesthesia: General | Site: Ureter | Laterality: Right

## 2018-07-14 MED ORDER — FENTANYL CITRATE (PF) 100 MCG/2ML IJ SOLN
25.0000 ug | INTRAMUSCULAR | Status: DC | PRN
  Filled 2018-07-14: qty 1

## 2018-07-14 MED ORDER — MIDAZOLAM HCL 2 MG/2ML IJ SOLN
INTRAMUSCULAR | Status: DC | PRN
  Administered 2018-07-14: 2 mg via INTRAVENOUS

## 2018-07-14 MED ORDER — PROPOFOL 10 MG/ML IV BOLUS
INTRAVENOUS | Status: DC | PRN
  Administered 2018-07-14: 50 mg via INTRAVENOUS
  Administered 2018-07-14: 100 mg via INTRAVENOUS
  Administered 2018-07-14: 50 mg via INTRAVENOUS

## 2018-07-14 MED ORDER — ONDANSETRON HCL 4 MG PO TABS: 4.0000 mg | ORAL_TABLET | Freq: Every day | ORAL | 1 refills | Status: DC | PRN

## 2018-07-14 MED ORDER — HYDROCODONE-ACETAMINOPHEN 5-325 MG PO TABS: 1.0000 | ORAL_TABLET | ORAL | 0 refills | Status: DC | PRN

## 2018-07-14 MED ORDER — DEXAMETHASONE SODIUM PHOSPHATE 10 MG/ML IJ SOLN
INTRAMUSCULAR | Status: DC | PRN
  Administered 2018-07-14: 10 mg via INTRAVENOUS

## 2018-07-14 MED ORDER — FENTANYL CITRATE (PF) 100 MCG/2ML IJ SOLN
INTRAMUSCULAR | Status: AC
  Filled 2018-07-14: qty 2

## 2018-07-14 MED ORDER — ONDANSETRON HCL 4 MG/2ML IJ SOLN
4.0000 mg | Freq: Once | INTRAMUSCULAR | Status: DC | PRN
  Filled 2018-07-14: qty 2

## 2018-07-14 MED ORDER — FUROSEMIDE 10 MG/ML IJ SOLN
INTRAMUSCULAR | Status: DC | PRN
  Administered 2018-07-14: 20 mg via INTRAMUSCULAR

## 2018-07-14 MED ORDER — KETOROLAC TROMETHAMINE 30 MG/ML IJ SOLN
INTRAMUSCULAR | Status: DC | PRN
  Administered 2018-07-14: 30 mg via INTRAVENOUS

## 2018-07-14 MED ORDER — MEPERIDINE HCL 25 MG/ML IJ SOLN
6.2500 mg | INTRAMUSCULAR | Status: DC | PRN
  Filled 2018-07-14: qty 1

## 2018-07-14 MED ORDER — DEXAMETHASONE SODIUM PHOSPHATE 10 MG/ML IJ SOLN
INTRAMUSCULAR | Status: AC
  Filled 2018-07-14: qty 1

## 2018-07-14 MED ORDER — CEFAZOLIN SODIUM-DEXTROSE 2-4 GM/100ML-% IV SOLN
2.0000 g | Freq: Once | INTRAVENOUS | Status: AC
  Administered 2018-07-14: 2 g via INTRAVENOUS
  Filled 2018-07-14: qty 100

## 2018-07-14 MED ORDER — OXYCODONE HCL 5 MG PO TABS
5.0000 mg | ORAL_TABLET | Freq: Once | ORAL | Status: DC | PRN
  Filled 2018-07-14: qty 1

## 2018-07-14 MED ORDER — CEFAZOLIN SODIUM-DEXTROSE 2-4 GM/100ML-% IV SOLN
INTRAVENOUS | Status: AC
  Filled 2018-07-14: qty 100

## 2018-07-14 MED ORDER — LACTATED RINGERS IV SOLN
INTRAVENOUS | Status: DC
  Administered 2018-07-14: 1000 mL via INTRAVENOUS
  Administered 2018-07-14: 12:00:00 via INTRAVENOUS
  Filled 2018-07-14: qty 1000

## 2018-07-14 MED ORDER — OXYBUTYNIN CHLORIDE 5 MG PO TABS: 5.0000 mg | ORAL_TABLET | Freq: Three times a day (TID) | ORAL | 1 refills | Status: DC | PRN

## 2018-07-14 MED ORDER — STERILE WATER FOR IRRIGATION IR SOLN
Status: DC | PRN
  Administered 2018-07-14: 3000 mL

## 2018-07-14 MED ORDER — ACETAMINOPHEN 325 MG PO TABS
325.0000 mg | ORAL_TABLET | ORAL | Status: DC | PRN
  Filled 2018-07-14: qty 2

## 2018-07-14 MED ORDER — LIDOCAINE 2% (20 MG/ML) 5 ML SYRINGE
INTRAMUSCULAR | Status: DC | PRN
  Administered 2018-07-14: 100 mg via INTRAVENOUS

## 2018-07-14 MED ORDER — IOHEXOL 300 MG/ML  SOLN
INTRAMUSCULAR | Status: DC | PRN
  Administered 2018-07-14: 7 mL via URETHRAL

## 2018-07-14 MED ORDER — FUROSEMIDE 10 MG/ML IJ SOLN
INTRAMUSCULAR | Status: AC
  Filled 2018-07-14: qty 2

## 2018-07-14 MED ORDER — ONDANSETRON HCL 4 MG/2ML IJ SOLN
INTRAMUSCULAR | Status: AC
  Filled 2018-07-14: qty 2

## 2018-07-14 MED ORDER — PHENAZOPYRIDINE HCL 200 MG PO TABS: 200.0000 mg | ORAL_TABLET | Freq: Three times a day (TID) | ORAL | 0 refills | Status: DC | PRN

## 2018-07-14 MED ORDER — PROPOFOL 10 MG/ML IV BOLUS
INTRAVENOUS | Status: AC
  Filled 2018-07-14: qty 40

## 2018-07-14 MED ORDER — CEPHALEXIN 500 MG PO CAPS: 500.0000 mg | ORAL_CAPSULE | Freq: Three times a day (TID) | ORAL | 0 refills | Status: AC

## 2018-07-14 MED ORDER — KETOROLAC TROMETHAMINE 30 MG/ML IJ SOLN
INTRAMUSCULAR | Status: AC
  Filled 2018-07-14: qty 1

## 2018-07-14 MED ORDER — FENTANYL CITRATE (PF) 100 MCG/2ML IJ SOLN
INTRAMUSCULAR | Status: DC | PRN
  Administered 2018-07-14 (×2): 50 ug via INTRAVENOUS

## 2018-07-14 MED ORDER — KETOROLAC TROMETHAMINE 30 MG/ML IJ SOLN
30.0000 mg | Freq: Once | INTRAMUSCULAR | Status: DC | PRN
  Filled 2018-07-14: qty 1

## 2018-07-14 MED ORDER — ACETAMINOPHEN 160 MG/5ML PO SOLN
325.0000 mg | ORAL | Status: DC | PRN
  Filled 2018-07-14: qty 20.3

## 2018-07-14 MED ORDER — MIDAZOLAM HCL 2 MG/2ML IJ SOLN
INTRAMUSCULAR | Status: AC
  Filled 2018-07-14: qty 2

## 2018-07-14 MED ORDER — OXYCODONE HCL 5 MG/5ML PO SOLN
5.0000 mg | Freq: Once | ORAL | Status: DC | PRN
  Filled 2018-07-14: qty 5

## 2018-07-14 SURGICAL SUPPLY — 16 items
BAG URO CATCHER STRL LF (MISCELLANEOUS) ×2 IMPLANT
BASKET ZERO TIP NITINOL 2.4FR (BASKET) ×2 IMPLANT
CATH URET 5FR 28IN OPEN ENDED (CATHETERS) ×2 IMPLANT
CLOTH BEACON ORANGE TIMEOUT ST (SAFETY) ×2 IMPLANT
FIBER LASER FLEXIVA 1000 (UROLOGICAL SUPPLIES) IMPLANT
FIBER LASER FLEXIVA 365 (UROLOGICAL SUPPLIES) ×2 IMPLANT
FIBER LASER FLEXIVA 550 (UROLOGICAL SUPPLIES) IMPLANT
GLOVE BIO SURGEON STRL SZ7.5 (GLOVE) ×4 IMPLANT
GOWN STRL REUS W/TWL XL LVL3 (GOWN DISPOSABLE) ×4 IMPLANT
GUIDEWIRE ZIPWRE .038 STRAIGHT (WIRE) ×2 IMPLANT
MANIFOLD NEPTUNE II (INSTRUMENTS) ×2 IMPLANT
PACK CYSTO (CUSTOM PROCEDURE TRAY) ×2 IMPLANT
STENT URET 6FRX26 CONTOUR (STENTS) ×2 IMPLANT
SYR 10ML LL (SYRINGE) ×2 IMPLANT
SYRINGE IRR TOOMEY STRL 70CC (SYRINGE) IMPLANT
TUBING CONNECTING 10 (TUBING) ×2 IMPLANT

## 2018-07-14 NOTE — Anesthesia Preprocedure Evaluation (Signed)
Anesthesia Evaluation  Patient identified by MRN, date of birth, ID band Patient awake    Reviewed: Allergy & Precautions, NPO status , Patient's Chart, lab work & pertinent test results  Airway Mallampati: II       Dental no notable dental hx. (+) Teeth Intact   Pulmonary neg pulmonary ROS,    Pulmonary exam normal breath sounds clear to auscultation       Cardiovascular hypertension, Pt. on medications Normal cardiovascular exam Rhythm:Regular Rate:Normal     Neuro/Psych Anxiety negative neurological ROS     GI/Hepatic GERD  Medicated and Controlled,  Endo/Other  negative endocrine ROS  Renal/GU      Musculoskeletal negative musculoskeletal ROS (+)   Abdominal (+) + obese,   Peds  Hematology  (+) anemia ,   Anesthesia Other Findings   Reproductive/Obstetrics                             Anesthesia Physical Anesthesia Plan  ASA: II  Anesthesia Plan: General   Post-op Pain Management:    Induction: Intravenous  PONV Risk Score and Plan: 4 or greater and Ondansetron and Dexamethasone  Airway Management Planned: LMA  Additional Equipment:   Intra-op Plan:   Post-operative Plan: Extubation in OR  Informed Consent: I have reviewed the patients History and Physical, chart, labs and discussed the procedure including the risks, benefits and alternatives for the proposed anesthesia with the patient or authorized representative who has indicated his/her understanding and acceptance.   Dental advisory given  Plan Discussed with: CRNA and Surgeon  Anesthesia Plan Comments:         Anesthesia Quick Evaluation

## 2018-07-14 NOTE — Anesthesia Postprocedure Evaluation (Signed)
Anesthesia Post Note  Patient: Joseph Booth  Procedure(s) Performed: CYSTOSCOPY WITH RIGHT RETROGRADE PYELOGRAM, RIGHT URETEROSCOPY WITH LASER LITHOTRIPSY AND STONE BASKETTING AND STENT PLACEMENT (Right Ureter)     Patient location during evaluation: PACU Anesthesia Type: General Level of consciousness: awake Pain management: pain level controlled Vital Signs Assessment: post-procedure vital signs reviewed and stable Respiratory status: spontaneous breathing Cardiovascular status: stable Postop Assessment: no apparent nausea or vomiting Anesthetic complications: no    Last Vitals:  Vitals:   07/14/18 1315 07/14/18 1330  BP: 130/90 (!) 141/90  Pulse: 73 74  Resp: 13 14  Temp:  36.6 C  SpO2: 93% 94%    Last Pain:  Vitals:   07/14/18 1330  TempSrc:   PainSc: 0-No pain   Pain Goal:                 [name],JOHN Shakeria Robinette

## 2018-07-14 NOTE — Transfer of Care (Signed)
Immediate Anesthesia Transfer of Care Note  Patient: Joseph Booth  Procedure(s) Performed: CYSTOSCOPY WITH RIGHT RETROGRADE PYELOGRAM, RIGHT URETEROSCOPY WITH LASER LITHOTRIPSY AND STONE BASKETTING AND STENT PLACEMENT (Right Ureter)  Patient Location: PACU  Anesthesia Type:General  Level of Consciousness: drowsy  Airway & Oxygen Therapy: Patient Spontanous Breathing and Patient connected to nasal cannula oxygen  Post-op Assessment: Report given to RN  Post vital signs: Reviewed and stable  Last Vitals:  Vitals Value Taken Time  BP 137/85 07/14/2018  1:03 PM  Temp    Pulse 80 07/14/2018  1:04 PM  Resp 12 07/14/2018  1:04 PM  SpO2 92 % 07/14/2018  1:04 PM  Vitals shown include unvalidated device data.  Last Pain:  Vitals:   07/14/18 1303  TempSrc:   PainSc: (P) Asleep         Complications: No apparent anesthesia complications

## 2018-07-14 NOTE — Discharge Instructions (Signed)
°  Post Anesthesia Home Care Instructions  Activity: Get plenty of rest for the remainder of the day. A responsible individual must stay with you for 24 hours following the procedure.  For the next 24 hours, DO NOT: -Drive a car -Operate machinery -Drink alcoholic beverages -Take any medication unless instructed by your physician -Make any legal decisions or sign important papers.  Meals: Start with liquid foods such as gelatin or soup. Progress to regular foods as tolerated. Avoid greasy, spicy, heavy foods. If nausea and/or vomiting occur, drink only clear liquids until the nausea and/or vomiting subsides. Call your physician if vomiting continues.  Special Instructions/Symptoms: Your throat may feel dry or sore from the anesthesia or the breathing tube placed in your throat during surgery. If this causes discomfort, gargle with warm salt water. The discomfort should disappear within 24 hours.  If you had a scopolamine patch placed behind your ear for the management of post- operative nausea and/or vomiting:  1. The medication in the patch is effective for 72 hours, after which it should be removed.  Wrap patch in a tissue and discard in the trash. Wash hands thoroughly with soap and water. 2. You may remove the patch earlier than 72 hours if you experience unpleasant side effects which may include dry mouth, dizziness or visual disturbances. 3. Avoid touching the patch. Wash your hands with soap and water after contact with the patch.    Post Anesthesia Home Care Instructions  Activity: Get plenty of rest for the remainder of the day. A responsible individual must stay with you for 24 hours following the procedure.  For the next 24 hours, DO NOT: -Drive a car -Operate machinery -Drink alcoholic beverages -Take any medication unless instructed by your physician -Make any legal decisions or sign important papers.  Meals: Start with liquid foods such as gelatin or soup. Progress to  regular foods as tolerated. Avoid greasy, spicy, heavy foods. If nausea and/or vomiting occur, drink only clear liquids until the nausea and/or vomiting subsides. Call your physician if vomiting continues.  Special Instructions/Symptoms: Your throat may feel dry or sore from the anesthesia or the breathing tube placed in your throat during surgery. If this causes discomfort, gargle with warm salt water. The discomfort should disappear within 24 hours.  If you had a scopolamine patch placed behind your ear for the management of post- operative nausea and/or vomiting:  1. The medication in the patch is effective for 72 hours, after which it should be removed.  Wrap patch in a tissue and discard in the trash. Wash hands thoroughly with soap and water. 2. You may remove the patch earlier than 72 hours if you experience unpleasant side effects which may include dry mouth, dizziness or visual disturbances. 3. Avoid touching the patch. Wash your hands with soap and water after contact with the patch.    

## 2018-07-14 NOTE — H&P (Signed)
Urology Preoperative H&P   Chief Complaint: Bladder stone  History of Present Illness: Joseph Booth is a 57 y.o. male with a 1.2 cm bladder stone seen on recent CT abd/pel.  He has a history of kidney stones and was recently seen by Dr. Pilar Jarvis in the office for right sided abdominal pain, which prompted his CT.  He is voiding without difficulty and denies interval UTIs, dysuria or hematuria.     Past Medical History:  Diagnosis Date  . Bladder stones   . Chronic gastritis 02/2018  . GERD (gastroesophageal reflux disease)   . History of gastric ulcer   . History of kidney stones   . History of stress test 02-09-2018   dr [name]   normal stress echo, normal RVSF, mild TR  . Hypertension    cardiologist-  dr [name] ([facility] [place])  . Mixed hyperlipidemia   . Nephrolithiasis    per renal ultrasound 02-19-2018 right renal stone nonobstructive    Past Surgical History:  Procedure Laterality Date  . COLONOSCOPY  last one 2016  . TONSILLECTOMY  1969  . TYMPANOPLASTY Right 2000  . UPPER GASTROINTESTINAL ENDOSCOPY  last one 02-24-2018  . WRIST GANGLION EXCISION Left ?    Allergies: No Known Allergies  Family History  Problem Relation Age of Onset  . Alcohol abuse Mother   . Mental illness Sister   . Squamous cell carcinoma Father   . Colon cancer Neg Hx   . Colon polyps Neg Hx   . Esophageal cancer Neg Hx   . Stomach cancer Neg Hx   . Rectal cancer Neg Hx     Social History:  reports that he has never smoked. He has never used smokeless tobacco. He reports that he drinks alcohol. He reports that he does not use drugs.  ROS: A complete review of systems was performed.  All systems are negative except for pertinent findings as noted.  Physical Exam:  Vital signs in last 24 hours:   Constitutional:  Alert and oriented, No acute distress Cardiovascular: Regular rate and rhythm, No JVD Respiratory: Normal respiratory effort, Lungs clear bilaterally GI: Abdomen is  soft, nontender, nondistended, no abdominal masses GU: No CVA tenderness Lymphatic: No lymphadenopathy Neurologic: Grossly intact, no focal deficits Psychiatric: Normal mood and affect  Laboratory Data:  No results for input(s): WBC, HGB, HCT, PLT in the last 72 hours.  No results for input(s): NA, K, CL, GLUCOSE, BUN, CALCIUM, CREATININE in the last 72 hours.  Invalid input(s): CO3   No results found for this or any previous visit (from the past 24 hour(s)). No results found for this or any previous visit (from the past 240 hour(s)).  Renal Function: No results for input(s): CREATININE in the last 168 hours. CrCl cannot be calculated (Patient's most recent lab result is older than the maximum 21 days allowed.).  Radiologic Imaging: No results found.  I independently reviewed the above imaging studies.  Assessment and Plan [name] is a 57 y.o. male with a 1.2 cm bladder stone  -The risks, benefits and alternatives of cystolithalopaxy was discussed with the patient.  Risks include, but are not limited to, bleeding, UTI, urethral injury, urethral stricture disease, retained stone fragments, bladder injury, the potential need to place a Foley catheter, MI, CVA, DVT, PE and the inherent risks with general anesthesia.    Ellison Hughs, MD 07/14/2018, 8:30 AM  [facility] Pager: [phone]

## 2018-07-14 NOTE — Op Note (Signed)
Operative Note  Preoperative diagnosis:  1.  1.2 cm bladder stone  Postoperative diagnosis: 1.  1.2 cm right ureterovesical junction stone  Procedure(s): 1.  Cystoscopy 2.  Right retrograde pyelogram with intraoperative interpretation of fluoroscopic imaging 3.  Right ureteroscopy 4.  Right holmium laser lithotripsy 5.  Right JJ stent placement  Surgeon: Ellison Hughs, MD  Assistants:  None  Anesthesia:  General  Complications:  None  EBL: 5 mL  Specimens: 1.  Right ureteral stone  Drains/Catheters: 1.  Right 6 French by 26 cm JJ stent without tether  Intraoperative findings:   1. 1.2 cm right UVJ stone with uniform dilation of the right distal ureter with no evidence of hydronephrosis involving the right renal pelvis.  Indication:  Joseph Booth is a 57 y.o. male recently seen in consultation by Dr. Pilar Jarvis for a suspected 1.2 cm bladder stone seen on CT from August 2019.  The patient is also having intermittent episodes of dull right-sided flank pain.  He has been consented for the above procedures, voices understanding and wishes to proceed.  Description of procedure:  After informed consent was obtained, the patient was brought to the operating room and general LMA anesthesia was administered. The patient was then placed in the dorsolithotomy position and prepped and draped in usual sterile fashion. A timeout was performed. A 23 French rigid cystoscope was then inserted into the urethral meatus and advanced into the bladder under direct vision. A complete bladder survey revealed no intravesical pathology.  The patient was found to have a pronounced right UVJ and I could see a small glimpse of a distal ureteral stone with manipulation of his UVJ with a wire.  A Glidewire was manipulated beyond the level of his right distal stone and up to the right renal pelvis, under fluoroscopic guidance.    A semirigid ureteroscope was then advanced into the distal aspects of the right  ureter, immediately identifying is 1.2 cm stone.  A 365 m holmium laser was then used to fracture the stone into numerous smaller pieces.  All large stone fragments were then removed from the lumen of the right ureter.  A retrograde pyelogram was obtained through the semirigid ureteroscope showing uniform dilation of the ureter with no evidence of any dilation involving the right renal pelvis or its associated calyces.  There were no other filling defects seen within the right collecting system.     A 6 French by 26 cm JJ stent was then placed over the wire and into good position within the right collecting system, confirming placement via fluoroscopy.  All stone fragments were then evacuated from the lumen of the bladder through the cystoscope sheath.  There was a small amount of bleeding around his right ureteral orifice that was carefully cauterized with the Bugbee, achieving hemostasis.  The patient tolerated the procedure well and was transferred to the postanesthesia in stable condition.  Plan: Follow-up in 1 week for office cystoscopy and stent removal

## 2018-07-14 NOTE — Anesthesia Procedure Notes (Signed)
Procedure Name: LMA Insertion Date/Time: 07/14/2018 11:50 AM Performed by: Bonney Aid, CRNA Pre-anesthesia Checklist: Patient identified, Emergency Drugs available, Suction available and Patient being monitored Patient Re-evaluated:Patient Re-evaluated prior to induction Oxygen Delivery Method: Circle system utilized Preoxygenation: Pre-oxygenation with 100% oxygen Induction Type: IV induction Ventilation: Mask ventilation without difficulty LMA: LMA inserted LMA Size: 5.0 Number of attempts: 1 Airway Equipment and Method: Bite block Placement Confirmation: positive ETCO2 Tube secured with: Tape Dental Injury: Teeth and Oropharynx as per pre-operative assessment

## 2018-07-15 ENCOUNTER — Encounter (HOSPITAL_BASED_OUTPATIENT_CLINIC_OR_DEPARTMENT_OTHER): Payer: Self-pay | Admitting: Urology

## 2018-07-19 ENCOUNTER — Telehealth: Payer: Self-pay | Admitting: Gastroenterology

## 2018-07-19 NOTE — Telephone Encounter (Signed)
Please reach out to Joseph Booth.  His urologist sent me a copy of a recent CT scan.  This was non-IV contrast.  It was done for urinary stones.  It is noted in the impression that there were "subcentimeter lymph nodes in the sigmoid mesentery are greater in number than are normally expected.  An underlying colonic lesion cannot be excluded.  Please correlate clinically."  Can you please ask him to come into the office to discuss the findings and consider testing, next available return office visit with myself or extender.  Thank you

## 2018-07-19 NOTE — Telephone Encounter (Signed)
Left message on machine to call back  

## 2018-07-20 NOTE — Telephone Encounter (Signed)
Left message on machine to call back  

## 2018-07-20 NOTE — Telephone Encounter (Signed)
Message sent to the pt via My Chart to call and set up an appt to discuss CT

## 2018-07-27 ENCOUNTER — Encounter: Payer: Self-pay | Admitting: Physician Assistant

## 2018-07-27 ENCOUNTER — Ambulatory Visit: Payer: BC Managed Care – PPO | Admitting: Physician Assistant

## 2018-07-27 ENCOUNTER — Other Ambulatory Visit (INDEPENDENT_AMBULATORY_CARE_PROVIDER_SITE_OTHER): Payer: BC Managed Care – PPO

## 2018-07-27 VITALS — BP 128/72 | HR 99 | Ht 71.0 in | Wt 231.5 lb

## 2018-07-27 DIAGNOSIS — D649 Anemia, unspecified: Secondary | ICD-10-CM | POA: Diagnosis not present

## 2018-07-27 DIAGNOSIS — Z1211 Encounter for screening for malignant neoplasm of colon: Secondary | ICD-10-CM | POA: Diagnosis not present

## 2018-07-27 DIAGNOSIS — R9389 Abnormal findings on diagnostic imaging of other specified body structures: Secondary | ICD-10-CM | POA: Diagnosis not present

## 2018-07-27 DIAGNOSIS — Z86010 Personal history of colonic polyps: Secondary | ICD-10-CM | POA: Diagnosis not present

## 2018-07-27 LAB — CBC
HCT: 39.3 % (ref 39.0–52.0)
Hemoglobin: 13.2 g/dL (ref 13.0–17.0)
MCHC: 33.5 g/dL (ref 30.0–36.0)
MCV: 82.9 fl (ref 78.0–100.0)
Platelets: 252 10*3/uL (ref 150.0–400.0)
RBC: 4.74 Mil/uL (ref 4.22–5.81)
RDW: 14.3 % (ref 11.5–15.5)
WBC: 11.7 10*3/uL — ABNORMAL HIGH (ref 4.0–10.5)

## 2018-07-27 MED ORDER — NA SULFATE-K SULFATE-MG SULF 17.5-3.13-1.6 GM/177ML PO SOLN: ORAL | 0 refills | Status: DC

## 2018-07-27 MED ORDER — PEG 3350-KCL-NABCB-NACL-NASULF 236 G PO SOLR: 4000.0000 mL | Freq: Once | ORAL | 0 refills | Status: AC

## 2018-07-27 NOTE — Patient Instructions (Addendum)
If you are age 55 or older, your body mass index should be between 23-30. Your Body mass index is 32.29 kg/m. If this is out of the aforementioned range listed, please consider follow up with your Primary Care Provider.  If you are age 104 or younger, your body mass index should be between 19-25. Your Body mass index is 32.29 kg/m. If this is out of the aformentioned range listed, please consider follow up with your Primary Care Provider.   You have been scheduled for a colonoscopy. Please follow written instructions given to you at your visit today.  Please pick up your prep supplies at the pharmacy within the next 1-3 days. If you use inhalers (even only as needed), please bring them with you on the day of your procedure. Your physician has requested that you go to www.startemmi.com and enter the access code given to you at your visit today. This web site gives a general overview about your procedure. However, you should still follow specific instructions given to you by our office regarding your preparation for the procedure.  We have sent the following medications to your pharmacy for you to pick up at your convenience: [facility] provider has requested that you go to the basement level for lab work before leaving today. Press "B" on the elevator. The lab is located at the first door on the left as you exit the elevator. CBC  Thank you for choosing me and [facility].  Amy Esterwood, PA-C

## 2018-07-27 NOTE — Progress Notes (Signed)
Subjective:    Patient ID: Joseph Booth, male    DOB: 06-09-1961, 57 y.o.   MRN: [id number]  HPI Joseph Booth is a pleasant 57 year old white male, known to Dr. Ardis Hughs who is referred back today for further evaluation of recent abnormal CT scan. Patient has history of hypertension, GERD, anxiety, and adenomatous colon polyps. He had undergone colonoscopy for screening in August 2017 was found to have 4 polyps the largest of which was 25 mm in the sigmoid colon, all of the polyps were removed.  Also noted a few diverticuli in the sigmoid colon and internal hemorrhoids.   Path of the largest polyp was consistent with a tubular adenoma, smaller ones were tubular adenomas and a sessile serrated polyp.  Patient has history of ureterolithiasis.  He says he has had intermittent symptoms from a stone over the past year which had gradually moved from his kidney down his ureter very He underwent cystoscopy, lithotripsy, right ureteroscopy and right double-J and stent, on 07/14/2018 per Dr. Ellison Hughs for a 1.2 cm UVJ stone. CT imaging done by stone protocol without IV contrast August 2019 by urology also showed subcentimeter lymph nodes in the sigmoid mesentery greater in number than normally expected, underlying colonic lesion could not be excluded, correlate clinically.  Patient has no current complaints of abdominal pain, feels better since lithotripsy and stent placement.  He did pass some blood around the time of the lithotripsy.  He has no complaints of changes in bowel habits no melena or hematochezia.  Appetite has been good weight has been stable.  Also note hemoglobin checked on 07/14/2018 was 9.2 hematocrit of 27, in April 2019 hemoglobin 13 hematocrit of 38.2  Review of Systems Pertinent positive and negative review of systems were noted in the above HPI section.  All other review of systems was otherwise negative.  Outpatient Encounter Medications as of 07/27/2018  Medication Sig  . amLODipine  (NORVASC) 10 MG tablet Take 1 tablet (10 mg total) by mouth daily. (Patient taking differently: Take 10 mg by mouth every morning. )  . HYDROcodone-acetaminophen (NORCO) 5-325 MG tablet Take 1 tablet by mouth every 4 (four) hours as needed for moderate pain.  Marland Kitchen ibuprofen (ADVIL,MOTRIN) 200 MG tablet Take 200 mg by mouth every 6 (six) hours as needed.  . ondansetron (ZOFRAN) 4 MG tablet Take 1 tablet (4 mg total) by mouth daily as needed for nausea or vomiting.  . [DISCONTINUED] oxybutynin (DITROPAN) 5 MG tablet Take 1 tablet (5 mg total) by mouth every 8 (eight) hours as needed for bladder spasms.  . [DISCONTINUED] phenazopyridine (PYRIDIUM) 200 MG tablet Take 1 tablet (200 mg total) by mouth 3 (three) times daily as needed (for pain with urination).  . Na Sulfate-K Sulfate-Mg Sulf 17.5-3.13-1.6 GM/177ML SOLN Suprep-Use as directed  . polyethylene glycol (GOLYTELY) 236 g solution Take 4,000 mLs by mouth once for 1 dose.  . [DISCONTINUED] esomeprazole (NEXIUM) 40 MG capsule Take 30-60 minutes before breakfast   No facility-administered encounter medications on file as of 07/27/2018.    No Known Allergies Patient Active Problem List   Diagnosis Date Noted  . Calculus of bladder 02/23/2018  . Nephrolithiasis 02/23/2018  . GERD (gastroesophageal reflux disease) 11/04/2017  . Anxiety 11/04/2017  . Encounter for preventative adult health care exam with abnormal findings 05/04/2016  . Essential hypertension 05/04/2016   Social History   Socioeconomic History  . Marital status: Married    Spouse name: Not on file  . Number of children: 2  .  Years of education: 62  . Highest education level: Not on file  Occupational History  . Occupation: Sales executive  Social Needs  . Financial resource strain: Not on file  . Food insecurity:    Worry: Not on file    Inability: Not on file  . Transportation needs:    Medical: Not on file    Non-medical: Not on file  Tobacco Use  . Smoking status: Never  Smoker  . Smokeless tobacco: Never Used  Substance and Sexual Activity  . Alcohol use: Yes    Alcohol/week: 0.0 - 1.0 standard drinks    Comment: occasional  . Drug use: No  . Sexual activity: Not on file  Lifestyle  . Physical activity:    Days per week: Not on file    Minutes per session: Not on file  . Stress: Not on file  Relationships  . Social connections:    Talks on phone: Not on file    Gets together: Not on file    Attends religious service: Not on file    Active member of club or organization: Not on file    Attends meetings of clubs or organizations: Not on file    Relationship status: Not on file  . Intimate partner violence:    Fear of current or ex partner: Not on file    Emotionally abused: Not on file    Physically abused: Not on file    Forced sexual activity: Not on file  Other Topics Concern  . Not on file  Social History Narrative  . Not on file    Mr. Winkleman family history includes Alcohol abuse in his mother; Mental illness in his sister; Squamous cell carcinoma in his father.      Objective:    Vitals:   07/27/18 1454  BP: 128/72  Pulse: 99    Physical Exam; developed white male in no acute distress, pleasant blood pressure 128/72 pulse 99, height 511, weight 231, BMI 32.2.  HEENT ;nontraumatic normocephalic EOMI PERRLA sclera anicteric oral mucosa moist, Cardiovascular; regular rate and rhythm with S1-S2 no murmur rub or gallop, Pulmonary clear bilaterally, Abdomen; soft, he has some minimal tenderness in the right lower quadrant no palpable mass or hepatosplenomegaly, no left lower quadrant discomfort or fullness.  Bowel sounds are present.  Rectal; exam not done, Extremities; no clubbing cyanosis or edema skin warm and dry, Neuro psych; alert and oriented, grossly nonfocal mood and affect appropriate       Assessment & Plan:   #35 57 year old white male with recent noncontrasted CT scan showing subcentimeter nodes in the sigmoid mesentery,  greater number than expected, raising concern for possible colonic lesion not visualized on the CT  Patient currently asymptomatic  #2 history of adenomatous and sessile serrated colon polyps, last colonoscopy August 2017 with largest polyp 25 mm removed from the sigmoid.  #3 hypertension #4.  GERD   #5 story of ureterolithiasis and recent cystoscopy right ureteroscopy and double-J stent placement for right UVJ stone 07/14/2018 # 6.  Anemia with drop in hemoglobin of 4 g since April 2019-  Plan; discussion today with patient regarding CT findings and concerns. Colonoscopy is indicated, and patient will be scheduled for colonoscopy with Dr. Ardis Hughs.  Procedure was discussed in detail regarding indications risks and benefits and he is agreeable to proceed.  Will also check repeat CBC today.  Amy Genia Harold PA-C 07/27/2018   Cc: Leone Haven, MD

## 2018-07-27 NOTE — Progress Notes (Signed)
I agree with the above note, plan 

## 2018-08-03 ENCOUNTER — Ambulatory Visit (AMBULATORY_SURGERY_CENTER): Payer: BC Managed Care – PPO | Admitting: Gastroenterology

## 2018-08-03 ENCOUNTER — Encounter: Payer: Self-pay | Admitting: Gastroenterology

## 2018-08-03 VITALS — BP 116/73 | HR 77 | Temp 98.7°F | Resp 9 | Ht 71.0 in | Wt 231.0 lb

## 2018-08-03 DIAGNOSIS — Z860101 Personal history of adenomatous and serrated colon polyps: Secondary | ICD-10-CM

## 2018-08-03 DIAGNOSIS — Z86010 Personal history of colonic polyps: Secondary | ICD-10-CM

## 2018-08-03 DIAGNOSIS — K5730 Diverticulosis of large intestine without perforation or abscess without bleeding: Secondary | ICD-10-CM

## 2018-08-03 MED ORDER — SODIUM CHLORIDE 0.9 % IV SOLN: 500.0000 mL | Freq: Once | INTRAVENOUS | Status: DC

## 2018-08-03 NOTE — Patient Instructions (Signed)
YOU HAD AN ENDOSCOPIC PROCEDURE TODAY AT [facility]:   Refer to the procedure report that was given to you for any specific questions about what was found during the examination.  If the procedure report does not answer your questions, please call your gastroenterologist to clarify.  If you requested that your care partner not be given the details of your procedure findings, then the procedure report has been included in a sealed envelope for you to review at your convenience later.  YOU SHOULD EXPECT: Some feelings of bloating in the abdomen. Passage of more gas than usual.  Walking can help get rid of the air that was put into your GI tract during the procedure and reduce the bloating. If you had a lower endoscopy (such as a colonoscopy or flexible sigmoidoscopy) you may notice spotting of blood in your stool or on the toilet paper. If you underwent a bowel prep for your procedure, you may not have a normal bowel movement for a few days.  Please Note:  You might notice some irritation and congestion in your nose or some drainage.  This is from the oxygen used during your procedure.  There is no need for concern and it should clear up in a day or so.  SYMPTOMS TO REPORT IMMEDIATELY:   Following lower endoscopy (colonoscopy or flexible sigmoidoscopy):  Excessive amounts of blood in the stool  Significant tenderness or worsening of abdominal pains  Swelling of the abdomen that is new, acute  Fever of 100F or higher   For urgent or emergent issues, a gastroenterologist can be reached at any hour by calling [phone].   DIET:  We do recommend a small meal at first, but then you may proceed to your regular diet.  Drink plenty of fluids but you should avoid alcoholic beverages for 24 hours. Try to increase the fiber in your diet,and drink plenty of water.  ACTIVITY:  You should plan to take it easy for the rest of today and you should NOT DRIVE or use heavy machinery until  tomorrow (because of the sedation medicines used during the test).    FOLLOW UP: Our staff will call the number listed on your records the next business day following your procedure to check on you and address any questions or concerns that you may have regarding the information given to you following your procedure. If we do not reach you, we will leave a message.  However, if you are feeling well and you are not experiencing any problems, there is no need to return our call.  We will assume that you have returned to your regular daily activities without incident.  If any biopsies were taken you will be contacted by phone or by letter within the next 1-3 weeks.  Please call us at [phone] if you have not heard about the biopsies in 3 weeks.    SIGNATURES/CONFIDENTIALITY: You and/or your care partner have signed paperwork which will be entered into your electronic medical record.  These signatures attest to the fact that that the information above on your After Visit Summary has been reviewed and is understood.  Full responsibility of the confidentiality of this discharge information lies with you and/or your care-partner.  You will need another colonoscopy in 5 years.

## 2018-08-03 NOTE — Op Note (Signed)
[facility] Patient Name: Joseph Booth Procedure Date: 08/03/2018 8:24 AM MRN: [id number] Endoscopist: Milus Banister , MD Age: 57 Referring MD:  Date of Birth: 09/22/1961 Gender: Male Account #: [id number] Procedure:                Colonoscopy Indications:              Abnormal CT of the GI tract: urologic CT suggested                            small but somewhat numerous sigmoid lymphnodes; no                            concerning colon symptoms: colonoscopy for                            screening in August 2017 was found to have 4 polyps                            the largest of which was 25 mm in the sigmoid                            colon, all of the polyps were removed. Also noted a                            few diverticuli in the sigmoid colon and internal                            hemorrhoids. Path of the largest polyp was                            consistent with a tubular adenoma, smaller ones                            were tubular adenomas and a sessile serrated polyp. Medicines:                Monitored Anesthesia Care Procedure:                Pre-Anesthesia Assessment:                           - Prior to the procedure, a History and Physical                            was performed, and patient medications and                            allergies were reviewed. The patient's tolerance of                            previous anesthesia was also reviewed. The risks                            and benefits of the procedure and the sedation  options and risks were discussed with the patient.                            All questions were answered, and informed consent                            was obtained. Prior Anticoagulants: The patient has                            taken no previous anticoagulant or antiplatelet                            agents. ASA Grade Assessment: II - A patient with                            mild systemic  disease. After reviewing the risks                            and benefits, the patient was deemed in                            satisfactory condition to undergo the procedure.                           After obtaining informed consent, the colonoscope                            was passed under direct vision. Throughout the                            procedure, the patient's blood pressure, pulse, and                            oxygen saturations were monitored continuously. The                            Colonoscope was introduced through the anus and                            advanced to the the cecum, identified by                            appendiceal orifice and ileocecal valve. The                            colonoscopy was performed without difficulty. The                            patient tolerated the procedure well. The quality                            of the bowel preparation was good. The ileocecal  valve, appendiceal orifice, and rectum were                            photographed. Scope In: 8:37:05 AM Scope Out: 8:51:52 AM Scope Withdrawal Time: 0 hours 11 minutes 34 seconds  Total Procedure Duration: 0 hours 14 minutes 47 seconds  Findings:                 Many small-mouthed diverticula were found in the                            left colon.                           The exam was otherwise without abnormality on                            direct and retroflexion views.                           No polyps or cancers. Complications:            No immediate complications. Estimated blood loss:                            None. Estimated Blood Loss:     Estimated blood loss: none. Impression:               - Diverticulosis in the left colon.                           - The examination was otherwise normal on direct                            and retroflexion views.                           - No polyps or cancers. Recommendation:           -  Patient has a contact number available for                            emergencies. The signs and symptoms of potential                            delayed complications were discussed with the                            patient. Return to normal activities tomorrow.                            Written discharge instructions were provided to the                            patient.                           - Resume previous diet.                           -  Continue present medications.                           - Repeat colonoscopy in 5 years for surveillance.                           - OV with Dr. Ardis Hughs in 2-3 months to discuss                            repeat imaging of pelvic lymphnodes. Milus Banister, MD 08/03/2018 8:58:09 AM This report has been signed electronically.

## 2018-08-03 NOTE — Progress Notes (Signed)
Spontaneous respirations throughout. VSS. Resting comfortably. To PACU on room air. Report to  RN. 

## 2018-08-04 ENCOUNTER — Telehealth: Payer: Self-pay | Admitting: *Deleted

## 2018-08-04 NOTE — Telephone Encounter (Signed)
  Follow up Call-  Call back number 08/03/2018 02/24/2018 07/09/2016  Post procedure Call Back phone  # [phone]  Permission to leave phone message Yes Yes Yes     Patient questions:  Do you have a fever, pain , or abdominal swelling? No. Pain Score  0 *  Have you tolerated food without any problems? Yes.    Have you been able to return to your normal activities? Yes.    Do you have any questions about your discharge instructions: Diet   No. Medications  No. Follow up visit  No.  Do you have questions or concerns about your Care? No.  Actions: * If pain score is 4 or above: No action needed, pain <4.

## 2018-09-07 ENCOUNTER — Telehealth: Payer: Self-pay | Admitting: Family Medicine

## 2018-09-07 ENCOUNTER — Ambulatory Visit (INDEPENDENT_AMBULATORY_CARE_PROVIDER_SITE_OTHER): Payer: BC Managed Care – PPO

## 2018-09-07 ENCOUNTER — Encounter: Payer: Self-pay | Admitting: Family Medicine

## 2018-09-07 ENCOUNTER — Ambulatory Visit (INDEPENDENT_AMBULATORY_CARE_PROVIDER_SITE_OTHER): Payer: BC Managed Care – PPO | Admitting: Family Medicine

## 2018-09-07 DIAGNOSIS — M79602 Pain in left arm: Secondary | ICD-10-CM | POA: Diagnosis not present

## 2018-09-07 DIAGNOSIS — M5020 Other cervical disc displacement, unspecified cervical region: Secondary | ICD-10-CM | POA: Diagnosis not present

## 2018-09-07 DIAGNOSIS — R200 Anesthesia of skin: Secondary | ICD-10-CM

## 2018-09-07 DIAGNOSIS — S4992XA Unspecified injury of left shoulder and upper arm, initial encounter: Secondary | ICD-10-CM | POA: Diagnosis not present

## 2018-09-07 DIAGNOSIS — S59902A Unspecified injury of left elbow, initial encounter: Secondary | ICD-10-CM | POA: Diagnosis not present

## 2018-09-07 DIAGNOSIS — M5030 Other cervical disc degeneration, unspecified cervical region: Secondary | ICD-10-CM

## 2018-09-07 DIAGNOSIS — S6992XA Unspecified injury of left wrist, hand and finger(s), initial encounter: Secondary | ICD-10-CM

## 2018-09-07 MED ORDER — PREDNISONE 10 MG (21) PO TBPK: ORAL_TABLET | ORAL | 0 refills | Status: DC

## 2018-09-07 MED ORDER — IBUPROFEN 600 MG PO TABS: 600.0000 mg | ORAL_TABLET | Freq: Three times a day (TID) | ORAL | 1 refills | Status: DC | PRN
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
## 2018-09-08 ENCOUNTER — Encounter: Payer: Self-pay | Admitting: Family Medicine

## 2018-09-09 ENCOUNTER — Telehealth: Payer: Self-pay | Admitting: Family Medicine

## 2018-09-09 NOTE — Telephone Encounter (Signed)
Work note done and on Engineer, drilling

## 2018-09-09 NOTE — Telephone Encounter (Signed)
Copied from [place] [id number]. Topic: General - Other >> Sep 09, 2018  2:14 PM Cecelia Byars, NT wrote: Reason for CRM: Patient called and needs a note for work for Tuesday thru Thursday please call him at  [phone], he needs for it mention numbness in his hand and also pain in his left shoulder and elbow ,and he is unable to pick anything heavy until his appointment with Lauren on 09/14/18, please call when it is ready

## 2018-09-10 NOTE — Telephone Encounter (Signed)
Letter is in the front for Pt to pick up

## 2018-09-14 ENCOUNTER — Ambulatory Visit (INDEPENDENT_AMBULATORY_CARE_PROVIDER_SITE_OTHER): Payer: BC Managed Care – PPO | Admitting: Family Medicine

## 2018-09-14 ENCOUNTER — Encounter: Payer: Self-pay | Admitting: Family Medicine

## 2018-09-14 VITALS — BP 132/80 | HR 82 | Temp 98.8°F | Ht 71.0 in | Wt 234.8 lb

## 2018-09-14 DIAGNOSIS — M5020 Other cervical disc displacement, unspecified cervical region: Secondary | ICD-10-CM | POA: Diagnosis not present

## 2018-09-14 DIAGNOSIS — M5030 Other cervical disc degeneration, unspecified cervical region: Secondary | ICD-10-CM

## 2018-09-14 DIAGNOSIS — R200 Anesthesia of skin: Secondary | ICD-10-CM | POA: Diagnosis not present

## 2018-09-14 DIAGNOSIS — M79602 Pain in left arm: Secondary | ICD-10-CM | POA: Diagnosis not present

## 2018-09-14 MED ORDER — TRAMADOL HCL 50 MG PO TABS: 50.0000 mg | ORAL_TABLET | Freq: Three times a day (TID) | ORAL | 0 refills | Status: DC | PRN
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
## 2018-09-21 IMAGING — US US RENAL
1 series · 14 of 25 positions shown · non-contrast
Comparison: None.

CLINICAL DATA: No known calculi

EXAM:
RENAL / URINARY TRACT ULTRASOUND COMPLETE

[Series 1: us renal · 0.26mm/px · 14 of 45 slices shown]
[im 1/45]
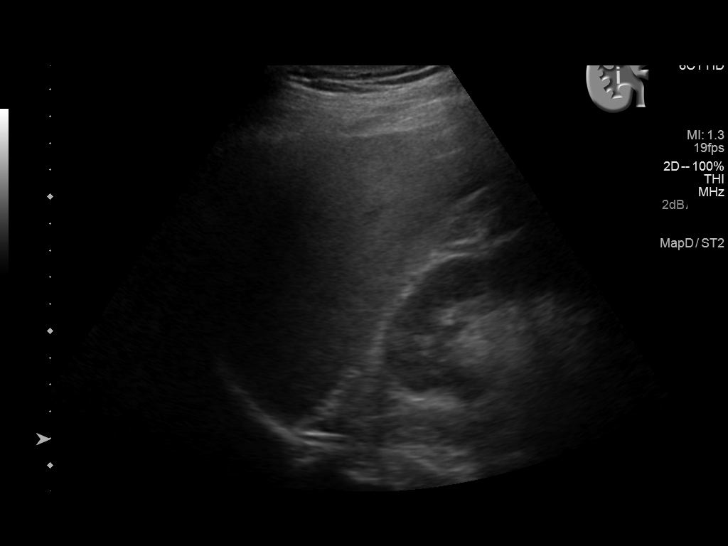
[im 4/45]
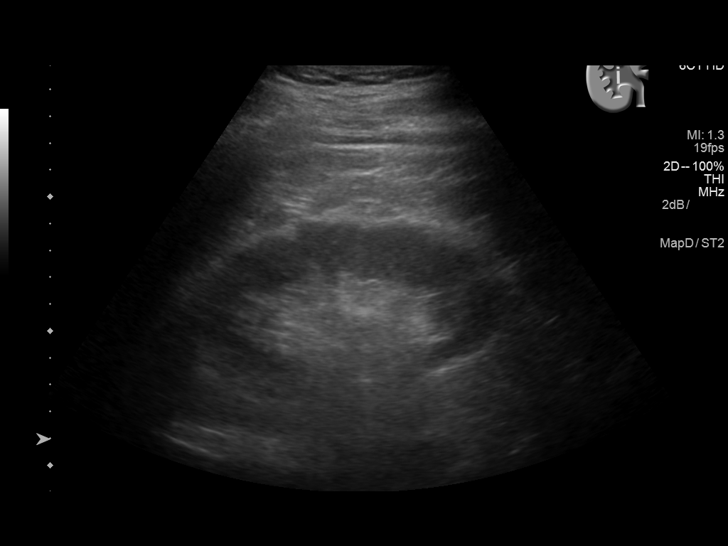
[im 8/45]
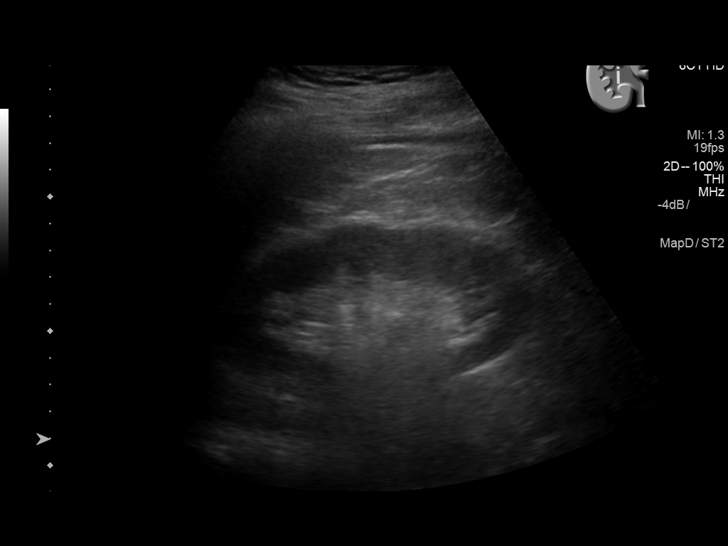
[im 12/45]
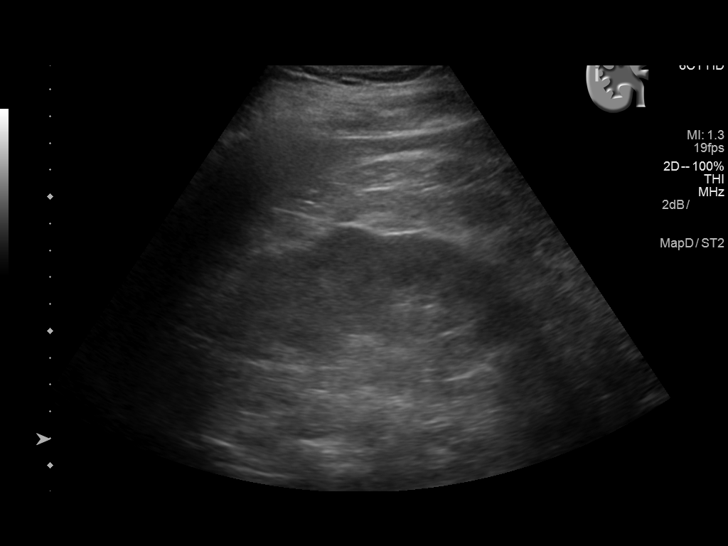
[im 15/45]
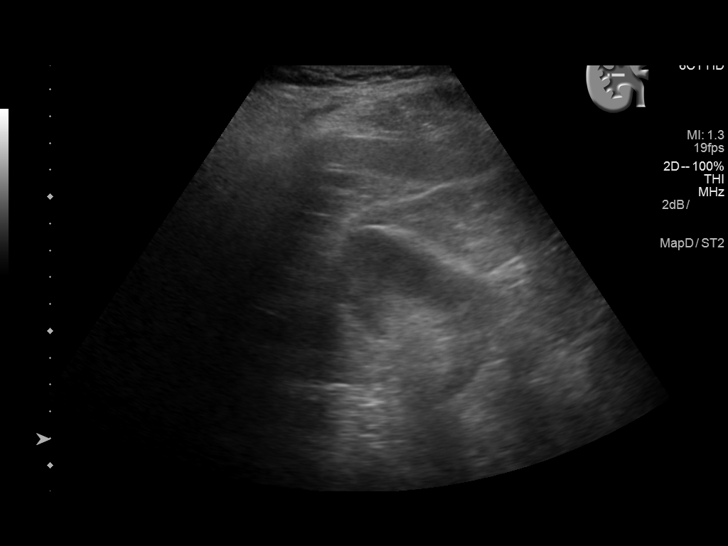
[im 17/45]
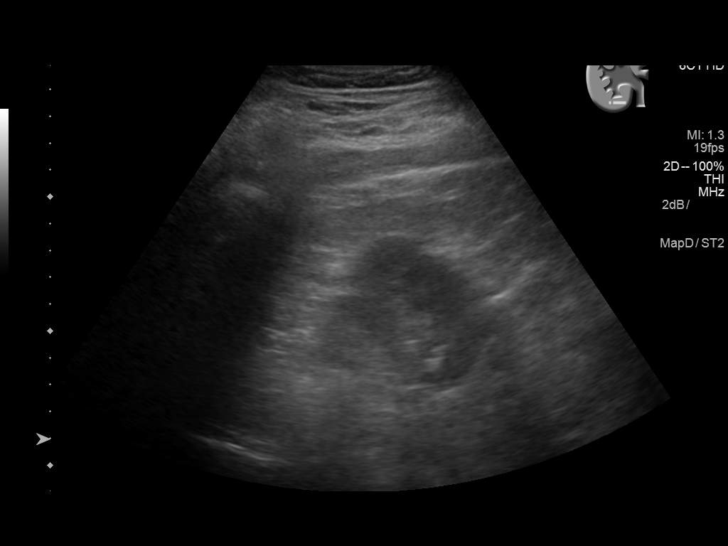
[im 21/45]
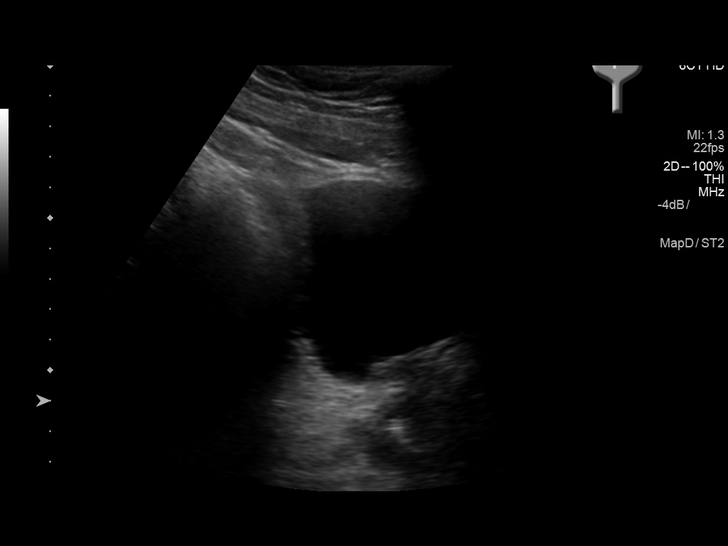
[im 24/45]
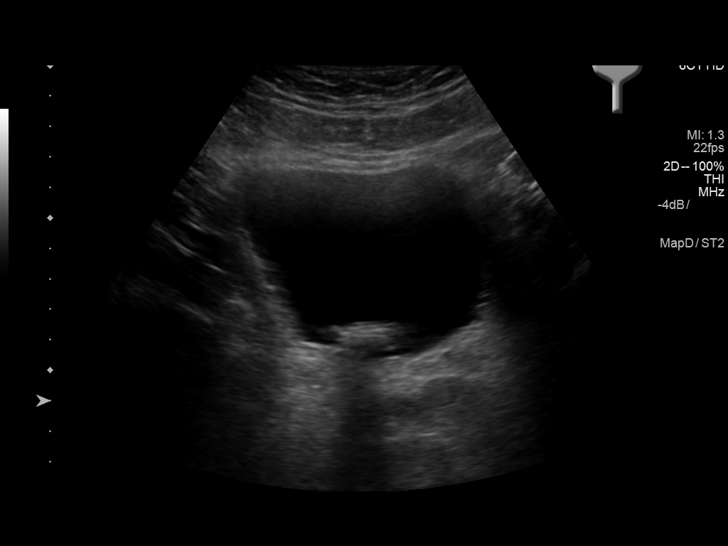
[im 28/45]
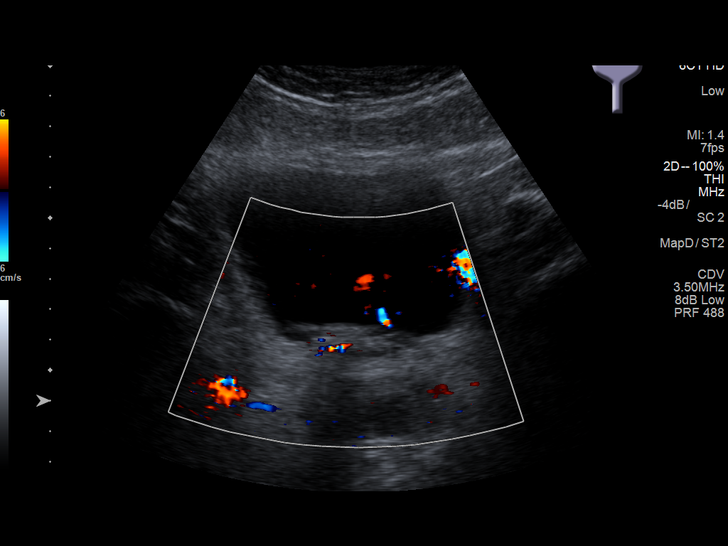
[im 30/45]
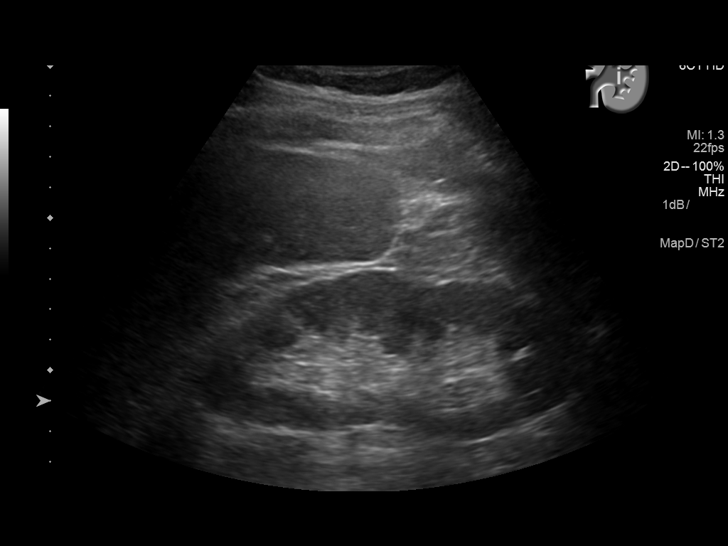
[im 34/45]
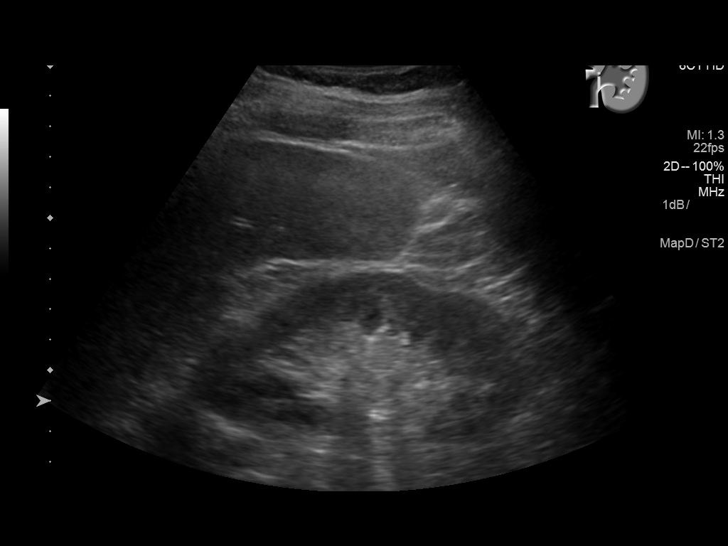
[im 37/45]
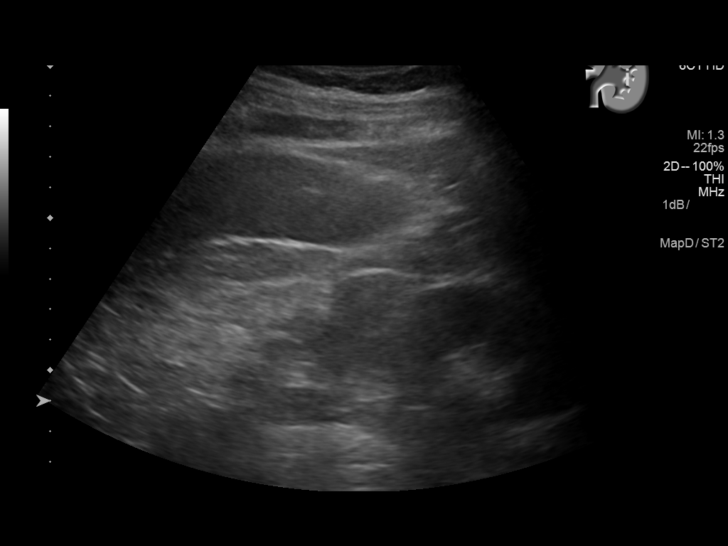
[im 41/45]
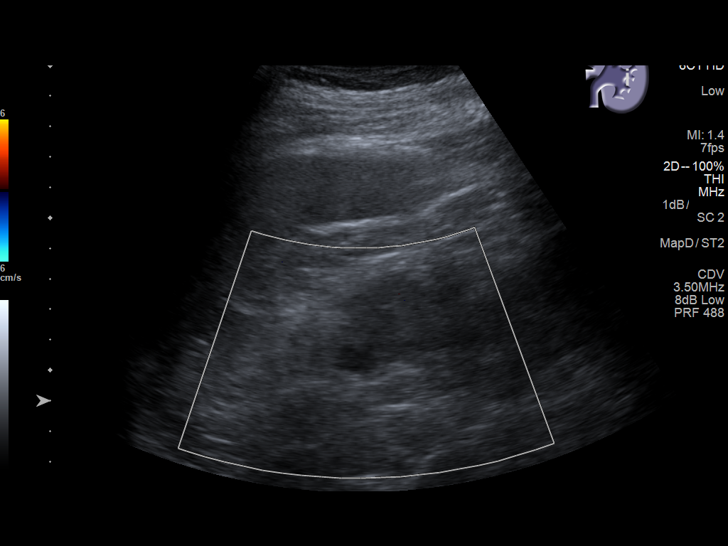
[im 45/45]
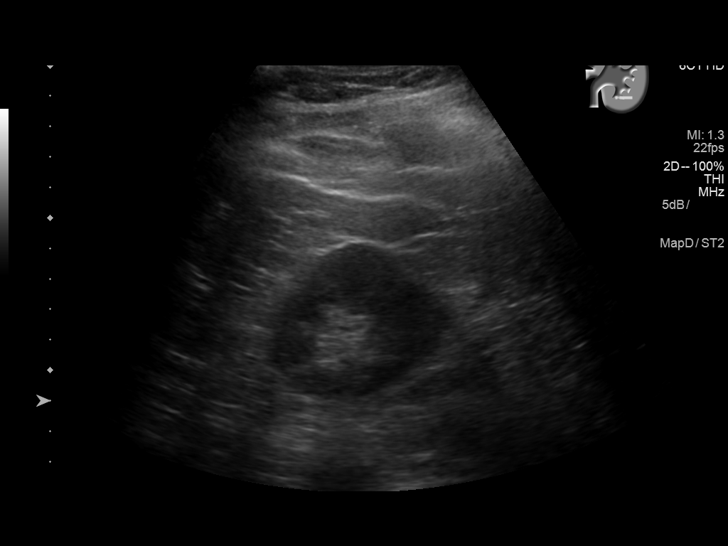

[14 of 25 positions shown; findings below may reference images not displayed]

FINDINGS: Right Kidney:

Length: 12.2 cm.. No obstructive changes are noted. A 8.2 mm stone
is noted in the mid to lower pole of the right kidney.

Left Kidney:

Length: 12.2 cm.. No obstructive changes are noted. There is a
cm hypoechoic lesion noted in the upper pole. This likely represents
a mildly complicated cyst. No other focal abnormality is noted.

Bladder:

Bladder is well distended. A bladder calculus is noted measuring
cm.
IMPRESSION: No evidence of hydronephrosis.

Nonobstructing right renal stone as well as bladder calculus.

Cystic lesion within the left kidney measuring 1.3 cm. Short-term
follow-up in 6 months is recommended to assess for stability.

## 2018-10-12 DIAGNOSIS — M542 Cervicalgia: Secondary | ICD-10-CM | POA: Insufficient documentation

## 2018-10-20 ENCOUNTER — Other Ambulatory Visit: Payer: Self-pay | Admitting: Student

## 2018-10-20 DIAGNOSIS — G5622 Lesion of ulnar nerve, left upper limb: Secondary | ICD-10-CM

## 2018-10-20 DIAGNOSIS — S46012A Strain of muscle(s) and tendon(s) of the rotator cuff of left shoulder, initial encounter: Secondary | ICD-10-CM

## 2018-11-04 ENCOUNTER — Other Ambulatory Visit: Payer: Self-pay | Admitting: Student

## 2018-11-04 ENCOUNTER — Ambulatory Visit: Admission: RE | Admit: 2018-11-04 | Payer: BC Managed Care – PPO | Source: Ambulatory Visit

## 2018-11-04 DIAGNOSIS — S46012A Strain of muscle(s) and tendon(s) of the rotator cuff of left shoulder, initial encounter: Secondary | ICD-10-CM

## 2018-11-04 DIAGNOSIS — G5622 Lesion of ulnar nerve, left upper limb: Secondary | ICD-10-CM

## 2018-11-09 ENCOUNTER — Ambulatory Visit
Admission: RE | Admit: 2018-11-09 | Discharge: 2018-11-09 | Disposition: A | Discharge status: Home / Self Care | Payer: BC Managed Care – PPO | Source: Ambulatory Visit | Attending: Student | Admitting: Student

## 2018-11-09 DIAGNOSIS — S46012A Strain of muscle(s) and tendon(s) of the rotator cuff of left shoulder, initial encounter: Secondary | ICD-10-CM | POA: Insufficient documentation

## 2018-11-09 DIAGNOSIS — G5622 Lesion of ulnar nerve, left upper limb: Secondary | ICD-10-CM | POA: Insufficient documentation

## 2019-01-12 DIAGNOSIS — M7522 Bicipital tendinitis, left shoulder: Secondary | ICD-10-CM

## 2019-01-12 DIAGNOSIS — M7582 Other shoulder lesions, left shoulder: Secondary | ICD-10-CM | POA: Insufficient documentation

## 2019-01-12 DIAGNOSIS — G5622 Lesion of ulnar nerve, left upper limb: Secondary | ICD-10-CM | POA: Insufficient documentation

## 2019-01-12 HISTORY — DX: Bicipital tendinitis, left shoulder: M75.22

## 2019-01-12 HISTORY — DX: Other shoulder lesions, left shoulder: M75.82

## 2019-02-03 ENCOUNTER — Other Ambulatory Visit: Payer: Self-pay | Admitting: Family Medicine

## 2019-04-18 ENCOUNTER — Other Ambulatory Visit: Payer: Self-pay

## 2019-04-18 ENCOUNTER — Encounter
Admission: RE | Admit: 2019-04-18 | Discharge: 2019-04-18 | Disposition: A | Discharge status: Home / Self Care | Payer: BC Managed Care – PPO | Source: Ambulatory Visit | Attending: Surgery | Admitting: Surgery

## 2019-04-18 DIAGNOSIS — Z1159 Encounter for screening for other viral diseases: Secondary | ICD-10-CM | POA: Insufficient documentation

## 2019-04-18 DIAGNOSIS — I10 Essential (primary) hypertension: Secondary | ICD-10-CM | POA: Insufficient documentation

## 2019-04-18 DIAGNOSIS — Z01818 Encounter for other preprocedural examination: Secondary | ICD-10-CM | POA: Insufficient documentation

## 2019-04-18 HISTORY — DX: Other seasonal allergic rhinitis: J30.2

## 2019-04-18 NOTE — Patient Instructions (Signed)
Your procedure is scheduled on: Thurs. 6/11 Report to Day Surgery. To find out your arrival time please call [phone] between 1PM - 3PM on Wed. 6/10.  Remember: Instructions that are not followed completely may result in serious medical risk,  up to and including death, or upon the discretion of your surgeon and anesthesiologist your  surgery may need to be rescheduled.     _X__ 1. Do not eat food after midnight the night before your procedure.                 No gum chewing or hard candies. You may drink clear liquids up to 2 hours                 before you are scheduled to arrive for your surgery- DO not drink clear                 liquids within 2 hours of the start of your surgery.                 Clear Liquids include:  water, apple juice without pulp, clear carbohydrate                 drink such as Clearfast of Gatorade, Black Coffee or Tea (Do not add                 anything to coffee or tea).  __X__2.  On the morning of surgery brush your teeth with toothpaste and water, you                may rinse your mouth with mouthwash if you wish.  Do not swallow any toothpaste of mouthwash.     _X__ 3.  No Alcohol for 24 hours before or after surgery.   ___ 4.  Do Not Smoke or use e-cigarettes For 24 Hours Prior to Your Surgery.                 Do not use any chewable tobacco products for at least 6 hours prior to                 surgery.  ____  5.  Bring all medications with you on the day of surgery if instructed.   _x__  6.  Notify your doctor if there is any change in your medical condition      (cold, fever, infections).     Do not wear jewelry, make-up, hairpins, clips or nail polish. Do not wear lotions, powders, or perfumes. You may wear deodorant. Do not shave 48 hours prior to surgery. Men may shave face and neck. Do not bring valuables to the hospital.    [facility] is not responsible for any belongings or valuables.  Contacts, dentures  or bridgework may not be worn into surgery. Leave your suitcase in the car. After surgery it may be brought to your room. For patients admitted to the hospital, discharge time is determined by your treatment team.   Patients discharged the day of surgery will not be allowed to drive home.   Please read over the following fact sheets that you were given:    _x___ Take these medicines the morning of surgery with A SIP OF WATER:    1. amLODipine (NORVASC) 10 MG tablet  2. esomeprazole (NEXIUM) 20 MG capsule take dose night before and morning of surgery  3.   4.  5.  6.  ____ Fleet Enema (as directed)  ___x_ Use CHG Soap as directed  ____ Use inhalers on the day of surgery  ____ Stop metformin 2 days prior to surgery    ____ Take 1/2 of usual insulin dose the night before surgery. No insulin the morning          of surgery.   ____ Stop Coumadin/Plavix/aspirin on   __x__ Stop Anti-inflammatories today   Ibuprofen or Aleve and Aspirin   _x___ Stop supplements until after surgery.  Vit. C  ____ Bring C-Pap to the hospital.

## 2019-04-19 LAB — NOVEL CORONAVIRUS, NAA (HOSP ORDER, SEND-OUT TO REF LAB; TAT 18-24 HRS): SARS-CoV-2, NAA: NOT DETECTED

## 2019-04-20 MED ORDER — CEFAZOLIN SODIUM-DEXTROSE 2-4 GM/100ML-% IV SOLN
2.0000 g | Freq: Once | INTRAVENOUS | Status: AC
  Administered 2019-04-21: 2 g via INTRAVENOUS

## 2019-04-21 ENCOUNTER — Encounter: Payer: Self-pay | Admitting: *Deleted

## 2019-04-21 ENCOUNTER — Encounter
Admission: RE | Disposition: A | Discharge status: Home / Self Care | Payer: Self-pay | Source: Home / Self Care | Attending: Surgery

## 2019-04-21 ENCOUNTER — Ambulatory Visit: Payer: BC Managed Care – PPO | Admitting: Anesthesiology

## 2019-04-21 ENCOUNTER — Ambulatory Visit
Admission: RE | Admit: 2019-04-21 | Discharge: 2019-04-21 | Disposition: A | Discharge status: Home / Self Care | Payer: BC Managed Care – PPO | Attending: Surgery | Admitting: Surgery

## 2019-04-21 ENCOUNTER — Other Ambulatory Visit: Payer: Self-pay

## 2019-04-21 DIAGNOSIS — M7522 Bicipital tendinitis, left shoulder: Secondary | ICD-10-CM | POA: Diagnosis not present

## 2019-04-21 DIAGNOSIS — S43432A Superior glenoid labrum lesion of left shoulder, initial encounter: Secondary | ICD-10-CM | POA: Insufficient documentation

## 2019-04-21 DIAGNOSIS — M25812 Other specified joint disorders, left shoulder: Secondary | ICD-10-CM | POA: Insufficient documentation

## 2019-04-21 DIAGNOSIS — I10 Essential (primary) hypertension: Secondary | ICD-10-CM | POA: Diagnosis not present

## 2019-04-21 DIAGNOSIS — K219 Gastro-esophageal reflux disease without esophagitis: Secondary | ICD-10-CM | POA: Diagnosis not present

## 2019-04-21 DIAGNOSIS — M25512 Pain in left shoulder: Secondary | ICD-10-CM | POA: Diagnosis present

## 2019-04-21 HISTORY — PX: SHOULDER ARTHROSCOPY WITH OPEN ROTATOR CUFF REPAIR: SHX6092

## 2019-04-21 SURGERY — ARTHROSCOPY, SHOULDER WITH REPAIR, ROTATOR CUFF, OPEN
Anesthesia: General | Laterality: Left

## 2019-04-21 MED ORDER — ACETAMINOPHEN 325 MG PO TABS: 325.0000 mg | ORAL_TABLET | ORAL | Status: DC | PRN

## 2019-04-21 MED ORDER — FENTANYL CITRATE (PF) 100 MCG/2ML IJ SOLN: 25.0000 ug | INTRAMUSCULAR | Status: DC | PRN

## 2019-04-21 MED ORDER — BUPIVACAINE-EPINEPHRINE 0.5% -1:200000 IJ SOLN
INTRAMUSCULAR | Status: DC | PRN
  Administered 2019-04-21: 30 mL

## 2019-04-21 MED ORDER — POTASSIUM CHLORIDE IN NACL 20-0.9 MEQ/L-% IV SOLN: INTRAVENOUS | Status: DC

## 2019-04-21 MED ORDER — FENTANYL CITRATE (PF) 100 MCG/2ML IJ SOLN
INTRAMUSCULAR | Status: DC | PRN
  Administered 2019-04-21 (×2): 50 ug via INTRAVENOUS

## 2019-04-21 MED ORDER — ONDANSETRON HCL 4 MG/2ML IJ SOLN: 4.0000 mg | Freq: Four times a day (QID) | INTRAMUSCULAR | Status: DC | PRN

## 2019-04-21 MED ORDER — FENTANYL CITRATE (PF) 100 MCG/2ML IJ SOLN
INTRAMUSCULAR | Status: AC
  Filled 2019-04-21: qty 2

## 2019-04-21 MED ORDER — SUGAMMADEX SODIUM 200 MG/2ML IV SOLN
INTRAVENOUS | Status: AC
  Filled 2019-04-21: qty 2

## 2019-04-21 MED ORDER — DEXAMETHASONE SODIUM PHOSPHATE 4 MG/ML IJ SOLN
INTRAMUSCULAR | Status: AC
  Filled 2019-04-21: qty 2

## 2019-04-21 MED ORDER — OXYCODONE HCL 5 MG PO TABS
ORAL_TABLET | ORAL | Status: AC
  Filled 2019-04-21: qty 1

## 2019-04-21 MED ORDER — BUPIVACAINE LIPOSOME 1.3 % IJ SUSP
INTRAMUSCULAR | Status: AC
  Filled 2019-04-21: qty 20

## 2019-04-21 MED ORDER — ACETAMINOPHEN 160 MG/5ML PO SOLN
325.0000 mg | ORAL | Status: DC | PRN
  Filled 2019-04-21: qty 20.3

## 2019-04-21 MED ORDER — BUPIVACAINE HCL (PF) 0.25 % IJ SOLN
INTRAMUSCULAR | Status: AC
  Filled 2019-04-21: qty 30

## 2019-04-21 MED ORDER — ONDANSETRON HCL 4 MG/2ML IJ SOLN
INTRAMUSCULAR | Status: AC
  Filled 2019-04-21: qty 2

## 2019-04-21 MED ORDER — SUCCINYLCHOLINE CHLORIDE 20 MG/ML IJ SOLN
INTRAMUSCULAR | Status: AC
  Filled 2019-04-21: qty 1

## 2019-04-21 MED ORDER — ACETAMINOPHEN 10 MG/ML IV SOLN
INTRAVENOUS | Status: DC | PRN
  Administered 2019-04-21: 1000 mg via INTRAVENOUS

## 2019-04-21 MED ORDER — LIDOCAINE HCL (CARDIAC) PF 100 MG/5ML IV SOSY
PREFILLED_SYRINGE | INTRAVENOUS | Status: DC | PRN
  Administered 2019-04-21: 60 mg via INTRAVENOUS

## 2019-04-21 MED ORDER — DEXAMETHASONE SODIUM PHOSPHATE 10 MG/ML IJ SOLN
INTRAMUSCULAR | Status: AC
  Filled 2019-04-21: qty 1

## 2019-04-21 MED ORDER — METOCLOPRAMIDE HCL 10 MG PO TABS: 5.0000 mg | ORAL_TABLET | Freq: Three times a day (TID) | ORAL | Status: DC | PRN

## 2019-04-21 MED ORDER — ROCURONIUM BROMIDE 50 MG/5ML IV SOLN
INTRAVENOUS | Status: AC
  Filled 2019-04-21: qty 1

## 2019-04-21 MED ORDER — LACTATED RINGERS IV SOLN
INTRAVENOUS | Status: DC
  Administered 2019-04-21 (×2): via INTRAVENOUS

## 2019-04-21 MED ORDER — ROCURONIUM BROMIDE 100 MG/10ML IV SOLN
INTRAVENOUS | Status: DC | PRN
  Administered 2019-04-21: 10 mg via INTRAVENOUS
  Administered 2019-04-21: 15 mg via INTRAVENOUS
  Administered 2019-04-21: 5 mg via INTRAVENOUS

## 2019-04-21 MED ORDER — MIDAZOLAM HCL 2 MG/2ML IJ SOLN
INTRAMUSCULAR | Status: AC
  Filled 2019-04-21: qty 2

## 2019-04-21 MED ORDER — OXYCODONE HCL 5 MG PO TABS: 5.0000 mg | ORAL_TABLET | ORAL | 0 refills | Status: DC | PRN

## 2019-04-21 MED ORDER — DEXAMETHASONE SODIUM PHOSPHATE 10 MG/ML IJ SOLN
INTRAMUSCULAR | Status: DC | PRN
  Administered 2019-04-21: 5 mg via INTRAVENOUS

## 2019-04-21 MED ORDER — ONDANSETRON HCL 4 MG PO TABS: 4.0000 mg | ORAL_TABLET | Freq: Four times a day (QID) | ORAL | Status: DC | PRN

## 2019-04-21 MED ORDER — ONDANSETRON HCL 4 MG/2ML IJ SOLN
INTRAMUSCULAR | Status: DC | PRN
  Administered 2019-04-21: 4 mg via INTRAVENOUS

## 2019-04-21 MED ORDER — BUPIVACAINE HCL (PF) 0.25 % IJ SOLN
INTRAMUSCULAR | Status: DC | PRN
  Administered 2019-04-21: 10 mL via EPIDURAL

## 2019-04-21 MED ORDER — EPINEPHRINE PF 1 MG/ML IJ SOLN
INTRAMUSCULAR | Status: AC
  Filled 2019-04-21: qty 1

## 2019-04-21 MED ORDER — BUPIVACAINE LIPOSOME 1.3 % IJ SUSP
INTRAMUSCULAR | Status: DC | PRN
  Administered 2019-04-21: 15 mL

## 2019-04-21 MED ORDER — PROPOFOL 10 MG/ML IV BOLUS
INTRAVENOUS | Status: DC | PRN
  Administered 2019-04-21: 140 mg via INTRAVENOUS

## 2019-04-21 MED ORDER — BUPIVACAINE-EPINEPHRINE (PF) 0.5% -1:200000 IJ SOLN
INTRAMUSCULAR | Status: AC
  Filled 2019-04-21: qty 30

## 2019-04-21 MED ORDER — LIDOCAINE HCL (PF) 2 % IJ SOLN
INTRAMUSCULAR | Status: AC
  Filled 2019-04-21: qty 10

## 2019-04-21 MED ORDER — PROMETHAZINE HCL 25 MG/ML IJ SOLN: 6.2500 mg | INTRAMUSCULAR | Status: DC | PRN

## 2019-04-21 MED ORDER — KETOROLAC TROMETHAMINE 30 MG/ML IJ SOLN
INTRAMUSCULAR | Status: AC
  Filled 2019-04-21: qty 1

## 2019-04-21 MED ORDER — PHENYLEPHRINE HCL (PRESSORS) 10 MG/ML IV SOLN
INTRAVENOUS | Status: AC
  Filled 2019-04-21: qty 1

## 2019-04-21 MED ORDER — ACETAMINOPHEN 10 MG/ML IV SOLN
INTRAVENOUS | Status: AC
  Filled 2019-04-21: qty 100

## 2019-04-21 MED ORDER — LACTATED RINGERS IV SOLN: INTRAVENOUS | Status: DC | PRN

## 2019-04-21 MED ORDER — PROPOFOL 10 MG/ML IV BOLUS
INTRAVENOUS | Status: AC
  Filled 2019-04-21: qty 20

## 2019-04-21 MED ORDER — SUCCINYLCHOLINE CHLORIDE 20 MG/ML IJ SOLN
INTRAMUSCULAR | Status: DC | PRN
  Administered 2019-04-21: 100 mg via INTRAVENOUS

## 2019-04-21 MED ORDER — MIDAZOLAM HCL 2 MG/2ML IJ SOLN
INTRAMUSCULAR | Status: DC | PRN
  Administered 2019-04-21 (×2): 1 mg via INTRAVENOUS

## 2019-04-21 MED ORDER — CEFAZOLIN SODIUM-DEXTROSE 2-4 GM/100ML-% IV SOLN
INTRAVENOUS | Status: AC
  Filled 2019-04-21: qty 100

## 2019-04-21 MED ORDER — OXYCODONE HCL 5 MG PO TABS
5.0000 mg | ORAL_TABLET | ORAL | Status: DC | PRN
  Administered 2019-04-21: 5 mg via ORAL

## 2019-04-21 MED ORDER — SUGAMMADEX SODIUM 200 MG/2ML IV SOLN
INTRAVENOUS | Status: DC | PRN
  Administered 2019-04-21: 200 mg via INTRAVENOUS

## 2019-04-21 MED ORDER — METOCLOPRAMIDE HCL 5 MG/ML IJ SOLN: 5.0000 mg | Freq: Three times a day (TID) | INTRAMUSCULAR | Status: DC | PRN

## 2019-04-21 MED ORDER — EPINEPHRINE PF 1 MG/ML IJ SOLN
INTRAMUSCULAR | Status: AC
  Filled 2019-04-21: qty 2

## 2019-04-21 SURGICAL SUPPLY — 42 items
ANCHOR JUGGERKNOT WTAP NDL 2.9 (Anchor) ×2 IMPLANT
BIT DRILL JUGRKNT W/NDL BIT2.9 (DRILL) ×1 IMPLANT
BLADE FULL RADIUS 3.5 (BLADE) ×2 IMPLANT
BUR ACROMIONIZER 4.0 (BURR) ×2 IMPLANT
CANNULA SHAVER 8MMX76MM (CANNULA) ×2 IMPLANT
CHLORAPREP W/TINT 26 (MISCELLANEOUS) ×2 IMPLANT
COVER MAYO STAND STRL (DRAPES) ×2 IMPLANT
COVER WAND RF STERILE (DRAPES) ×2 IMPLANT
DRAPE IMP U-DRAPE 54X76 (DRAPES) ×4 IMPLANT
DRILL JUGGERKNOT W/NDL BIT 2.9 (DRILL) ×2
ELECT REM PT RETURN 9FT ADLT (ELECTROSURGICAL) ×2
ELECTRODE REM PT RTRN 9FT ADLT (ELECTROSURGICAL) ×1 IMPLANT
GAUZE SPONGE 4X4 12PLY STRL (GAUZE/BANDAGES/DRESSINGS) ×2 IMPLANT
GAUZE XEROFORM 1X8 LF (GAUZE/BANDAGES/DRESSINGS) ×2 IMPLANT
GLOVE BIO SURGEON STRL SZ7.5 (GLOVE) ×4 IMPLANT
GLOVE BIO SURGEON STRL SZ8 (GLOVE) ×4 IMPLANT
GLOVE BIOGEL PI IND STRL 8 (GLOVE) ×1 IMPLANT
GLOVE BIOGEL PI INDICATOR 8 (GLOVE) ×1
GLOVE INDICATOR 8.0 STRL GRN (GLOVE) ×2 IMPLANT
GOWN STRL REUS W/ TWL LRG LVL3 (GOWN DISPOSABLE) ×1 IMPLANT
GOWN STRL REUS W/ TWL XL LVL3 (GOWN DISPOSABLE) ×1 IMPLANT
GOWN STRL REUS W/TWL LRG LVL3 (GOWN DISPOSABLE) ×1
GOWN STRL REUS W/TWL XL LVL3 (GOWN DISPOSABLE) ×1
GRASPER SUT 15 45D LOW PRO (SUTURE) IMPLANT
IV LACTATED RINGER IRRG 3000ML (IV SOLUTION) ×2
IV LR IRRIG 3000ML ARTHROMATIC (IV SOLUTION) ×2 IMPLANT
MANIFOLD NEPTUNE II (INSTRUMENTS) ×2 IMPLANT
MASK FACE SPIDER DISP (MASK) ×2 IMPLANT
MAT ABSORB  FLUID 56X50 GRAY (MISCELLANEOUS) ×1
MAT ABSORB FLUID 56X50 GRAY (MISCELLANEOUS) ×1 IMPLANT
PACK ARTHROSCOPY SHOULDER (MISCELLANEOUS) ×2 IMPLANT
SLING ARM LRG DEEP (SOFTGOODS) ×2 IMPLANT
SLING ULTRA II LG (MISCELLANEOUS) ×2 IMPLANT
STAPLER SKIN PROX 35W (STAPLE) ×2 IMPLANT
STRAP SAFETY 5IN WIDE (MISCELLANEOUS) ×2 IMPLANT
SUT ETHIBOND 0 MO6 C/R (SUTURE) ×2 IMPLANT
SUT VIC AB 2-0 CT1 27 (SUTURE) ×2
SUT VIC AB 2-0 CT1 TAPERPNT 27 (SUTURE) ×2 IMPLANT
TAPE MICROFOAM 4IN (TAPE) ×2 IMPLANT
TUBING ARTHRO INFLOW-ONLY STRL (TUBING) ×2 IMPLANT
TUBING CONNECTING 10 (TUBING) ×2 IMPLANT
WAND WEREWOLF FLOW 90D (MISCELLANEOUS) ×2 IMPLANT

## 2019-04-21 NOTE — Anesthesia Post-op Follow-up Note (Signed)
Anesthesia QCDR form completed.        

## 2019-04-21 NOTE — Anesthesia Procedure Notes (Signed)
Procedure Name: Intubation Date/Time: 04/21/2019 3:16 PM Performed by: Dionne Bucy, CRNA Pre-anesthesia Checklist: Patient identified, Patient being monitored, Timeout performed, Emergency Drugs available and Suction available Patient Re-evaluated:Patient Re-evaluated prior to induction Oxygen Delivery Method: Circle system utilized Preoxygenation: Pre-oxygenation with 100% oxygen Induction Type: IV induction Ventilation: Mask ventilation without difficulty Laryngoscope Size: 3, 4 and McGraph Grade View: Grade II Tube type: Oral Tube size: 7.5 mm Number of attempts: 1 Airway Equipment and Method: Stylet and Video-laryngoscopy Placement Confirmation: ETT inserted through vocal cords under direct vision,  positive ETCO2 and breath sounds checked- equal and bilateral Secured at: 22 cm Tube secured with: Tape Dental Injury: Teeth and Oropharynx as per pre-operative assessment  Difficulty Due To: Difficulty was anticipated and Difficult Airway- due to anterior larynx Future Recommendations: Recommend- induction with short-acting agent, and alternative techniques readily available

## 2019-04-21 NOTE — Anesthesia Postprocedure Evaluation (Signed)
Anesthesia Post Note  Patient: Joseph Booth  Procedure(s) Performed: SHOULDER ARTHROSCOPY WITH OPEN ROTATOR CUFF REPAIR (Left )  Patient location during evaluation: PACU Anesthesia Type: General Level of consciousness: awake and alert Pain management: pain level controlled Vital Signs Assessment: post-procedure vital signs reviewed and stable Respiratory status: spontaneous breathing, nonlabored ventilation, respiratory function stable and patient connected to nasal cannula oxygen Cardiovascular status: blood pressure returned to baseline and stable Postop Assessment: no apparent nausea or vomiting Anesthetic complications: no     Last Vitals:  Vitals:   04/21/19 1745 04/21/19 1754  BP:  117/81  Pulse: 85   Resp: 10   Temp:  (!) 36.1 C  SpO2: 92%     Last Pain:  Vitals:   04/21/19 1754  TempSrc:   PainSc: 10-Worst pain ever                 Precious Haws [place]it[place]

## 2019-04-21 NOTE — Transfer of Care (Signed)
Immediate Anesthesia Transfer of Care Note  Patient: Joseph Booth  Procedure(s) Performed: SHOULDER ARTHROSCOPY WITH OPEN ROTATOR CUFF REPAIR (Left )  Patient Location: PACU  Anesthesia Type:GA combined with regional for post-op pain  Level of Consciousness: awake and patient cooperative  Airway & Oxygen Therapy: Patient Spontanous Breathing and Patient connected to nasal cannula oxygen  Post-op Assessment: Report given to RN and Post -op Vital signs reviewed and stable  Post vital signs: Reviewed and stable  Last Vitals:  Vitals Value Taken Time  BP 129/81 04/21/19 1655  Temp    Pulse 87 04/21/19 1656  Resp 22 04/21/19 1655  SpO2 95 % 04/21/19 1656  Vitals shown include unvalidated device data.  Last Pain:  Vitals:   04/21/19 1215  TempSrc: Temporal  PainSc: 5          Complications: No apparent anesthesia complications
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
## 2019-04-21 NOTE — Anesthesia Procedure Notes (Signed)
Anesthesia Regional Block: Interscalene brachial plexus block   Pre-Anesthetic Checklist: ,, timeout performed, Correct Patient, Correct Site, Correct Laterality, Correct Procedure, Correct Position, site marked, Risks and benefits discussed,  Surgical consent,  Pre-op evaluation,  At surgeon's request and post-op pain management  Laterality: Left  Prep: chloraprep       Needles:  Injection technique: Single-shot  Needle Type: Echogenic Stimulator Needle     Needle Length: 10cm  Needle Gauge: 20   Needle insertion depth: 6 cm   Additional Needles:   Procedures: Doppler guided,,,, ultrasound used (permanent image in chart),,,,  Motor weakness within 3 minutes.  Narrative:  Start time: 04/21/2019 3:03 PM End time: 04/21/2019 3:09 PM  Performed by: Personally  Anesthesiologist: Alphonsus Sias, MD  Additional Notes: 25cc 50/50 mix exparel/0.25% bupiv/1:200 epi/4 mg decadron. Neg aspirate q 5 cc. 5cc skin wheal 0.25% bupiv plain. Tol well, very mild paraesthesia, reposition w/ no issues post

## 2019-04-21 NOTE — Anesthesia Preprocedure Evaluation (Addendum)
Anesthesia Evaluation  Patient identified by MRN, date of birth, ID band Patient awake    Reviewed: Allergy & Precautions, H&P , NPO status , reviewed documented beta blocker date and time   Airway Mallampati: II  TM Distance: >3 FB Neck ROM: full    Dental  (+) Teeth Intact   Pulmonary    Pulmonary exam normal        Cardiovascular hypertension, Normal cardiovascular exam     Neuro/Psych PSYCHIATRIC DISORDERS Anxiety    GI/Hepatic GERD  Medicated and Controlled,  Endo/Other    Renal/GU Renal disease     Musculoskeletal   Abdominal   Peds  Hematology   Anesthesia Other Findings Past Medical History: No date: Bladder stones 02/2018: Chronic gastritis No date: GERD (gastroesophageal reflux disease) No date: History of gastric ulcer No date: History of kidney stones 02-09-2018   dr Joseph Booth: History of stress test     Comment:  normal stress echo, normal RVSF, mild TR No date: Hypertension     Comment:  cardiologist-  dr Joseph Booth (Booth Joseph) No date: Mixed hyperlipidemia No date: Nephrolithiasis     Comment:  per renal ultrasound 02-19-2018 right renal stone               nonobstructive No date: Seasonal allergies Past Surgical History: last one 2016: COLONOSCOPY 07/14/2018: CYSTOSCOPY WITH LITHOLAPAXY; Right     Comment:  Procedure: CYSTOSCOPY WITH RIGHT RETROGRADE PYELOGRAM,               RIGHT URETEROSCOPY WITH LASER LITHOTRIPSY AND STONE               BASKETTING AND STENT PLACEMENT;  Surgeon: Joseph Mons, MD;  Location: [facility];  Service: Urology;  Laterality: Right; No date: EYE SURGERY     Comment:  lasik 1969: TONSILLECTOMY 2000: TYMPANOPLASTY; Right last one 02-24-2018: UPPER GASTROINTESTINAL ENDOSCOPY ?: WRIST GANGLION EXCISION; Left BMI    Body Mass Index: 32.64 kg/m     Reproductive/Obstetrics                             Anesthesia Physical Anesthesia Plan  ASA: II  Anesthesia Plan: General   Post-op Pain Management:  Regional for Post-op pain   Induction: Intravenous  PONV Risk Score and Plan: Ondansetron and Treatment may vary due to age or medical condition  Airway Management Planned: Oral ETT  Additional Equipment:   Intra-op Plan:   Post-operative Plan: Extubation in OR  Informed Consent: I have reviewed the patients History and Physical, chart, labs and discussed the procedure including the risks, benefits and alternatives for the proposed anesthesia with the patient or authorized representative who has indicated his/her understanding and acceptance.     Dental Advisory Given  Plan Discussed with: CRNA  Anesthesia Plan Comments:         Anesthesia Quick Evaluation

## 2019-04-21 NOTE — Discharge Instructions (Addendum)
Orthopedic discharge instructions: Keep dressing dry and intact.  May shower after dressing changed on post-op day #4 (Monday).  Cover staples with Band-Aids after drying off. Apply ice frequently to shoulder. Take ibuprofen 800 mg TID with meals for 7-10 days, then as necessary. Take oxycodone as prescribed when needed.  May supplement with ES Tylenol if necessary. Keep shoulder immobilizer on at all times except may remove for bathing purposes. Follow-up in 10-14 days or as scheduled.    AMBULATORY SURGERY  DISCHARGE INSTRUCTIONS   1) The drugs that you were given will stay in your system until tomorrow so for the next 24 hours you should not:  A) Drive an automobile B) Make any legal decisions C) Drink any alcoholic beverage   2) You may resume regular meals tomorrow.  Today it is better to start with liquids and gradually work up to solid foods.  You may eat anything you prefer, but it is better to start with liquids, then soup and crackers, and gradually work up to solid foods.   3) Please notify your doctor immediately if you have any unusual bleeding, trouble breathing, redness and pain at the surgery site, drainage, fever, or pain not relieved by medication.    4) Additional Instructions:        Please contact your physician with any problems or Same Day Surgery at [phone], Monday through Friday 6 am to 4 pm, or [place] [facility] number at [phone].

## 2019-04-21 NOTE — H&P (Signed)
Paper H&P to be scanned into permanent record. H&P reviewed and patient re-examined. No changes. 

## 2019-04-22 ENCOUNTER — Encounter: Payer: Self-pay | Admitting: Surgery

## 2019-04-22 DIAGNOSIS — S43432A Superior glenoid labrum lesion of left shoulder, initial encounter: Secondary | ICD-10-CM

## 2019-04-22 HISTORY — DX: Superior glenoid labrum lesion of left shoulder, initial encounter: S43.432A
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
## 2019-08-25 ENCOUNTER — Other Ambulatory Visit: Payer: Self-pay | Admitting: Family Medicine

## 2019-09-07 ENCOUNTER — Ambulatory Visit: Payer: BC Managed Care – PPO | Admitting: Family Medicine

## 2019-09-09 DIAGNOSIS — M7502 Adhesive capsulitis of left shoulder: Secondary | ICD-10-CM | POA: Insufficient documentation

## 2019-09-09 HISTORY — DX: Adhesive capsulitis of left shoulder: M75.02

## 2019-09-12 ENCOUNTER — Other Ambulatory Visit: Payer: Self-pay | Admitting: Surgery

## 2019-09-15 ENCOUNTER — Encounter
Admission: RE | Admit: 2019-09-15 | Discharge: 2019-09-15 | Disposition: A | Discharge status: Home / Self Care | Payer: BC Managed Care – PPO | Source: Ambulatory Visit | Attending: Surgery | Admitting: Surgery

## 2019-09-15 NOTE — Patient Instructions (Signed)
Your procedure is scheduled on: 09-20-19 TUESDAY Report to Same Day Surgery 2nd floor medical mall [facility] Entrance-take elevator on left to 2nd floor.  Check in with surgery information desk.) To find out your arrival time please call [phone] between 1PM - 3PM on 09-19-19 MONDAY  Remember: Instructions that are not followed completely may result in serious medical risk, up to and including death, or upon the discretion of your surgeon and anesthesiologist your surgery may need to be rescheduled.    _x___ 1. Do not eat food after midnight the night before your procedure. NO GUM OR CANDY AFTER MIDNIGHT. You may drink clear liquids up to 2 hours before you are scheduled to arrive at the hospital for your procedure.  Do not drink clear liquids within 2 hours of your scheduled arrival to the hospital.  Clear liquids include  --Water or Apple juice without pulp  --Gatorade  --Black Coffee or Clear Tea (No milk, no creamers, do not add anything to the coffee or Tea ____Ensure clear carbohydrate drink on the way to the hospital for bariatric patients  _X___Ensure clear carbohydrate drink 3 hours before surgery.    __x__ 2. No Alcohol for 24 hours before or after surgery.   __x__3. No Smoking or e-cigarettes for 24 prior to surgery.  Do not use any chewable tobacco products for at least 6 hour prior to surgery   ____  4. Bring all medications with you on the day of surgery if instructed.    __x__ 5. Notify your doctor if there is any change in your medical condition     (cold, fever, infections).    x___6. On the morning of surgery brush your teeth with toothpaste and water.  You may rinse your mouth with mouth wash if you wish.  Do not swallow any toothpaste or mouthwash.   Do not wear jewelry, make-up, hairpins, clips or nail polish.  Do not wear lotions, powders, or perfumes. You may wear deodorant.  Do not shave 48 hours prior to surgery. Men may shave face and neck.  Do not  bring valuables to the hospital.    [facility] is not responsible for any belongings or valuables.               Contacts, dentures or bridgework may not be worn into surgery.  Leave your suitcase in the car. After surgery it may be brought to your room.  For patients admitted to the hospital, discharge time is determined by your treatment team.  _  Patients discharged the day of surgery will not be allowed to drive home.  You will need someone to drive you home and stay with you the night of your procedure.    Please read over the following fact sheets that you were given:   [facility] Preparing for Surgery and or MRSA Information   _x___ TAKE THE FOLLOWING MEDICATION THE MORNING OF SURGERY WITH A SMALL SIP OF WATER. These include:  1. AMLODIPINE (NORVASC)  2. [place]  3. TAKE A NEXIUM THE NIGHT BEFORE YOUR SURGERY  4.  5.  6.  ____Fleets enema or Magnesium Citrate as directed.   ____ Use CHG Soap or sage wipes as directed on instruction sheet   ____ Use inhalers on the day of surgery and bring to hospital day of surgery  ____ Stop Metformin and Janumet 2 days prior to surgery.    ____ Take 1/2 of usual insulin dose the night before surgery and none on  the morning surgery.   ____ Follow recommendations from Cardiologist, Pulmonologist or PCP regarding stopping Aspirin, Coumadin, Plavix ,Eliquis, Effient, or Pradaxa, and Pletal.  X____Stop Anti-inflammatories such as Advil, Aleve, Ibuprofen, Motrin, Naproxen, Naprosyn, Goodies powders or aspirin products NOW-OK to take Tylenol    ____ Stop supplements until after surgery.    ____ Bring C-Pap to the hospital.

## 2019-09-16 ENCOUNTER — Other Ambulatory Visit: Payer: Self-pay

## 2019-09-16 ENCOUNTER — Other Ambulatory Visit
Admission: RE | Admit: 2019-09-16 | Discharge: 2019-09-16 | Disposition: A | Discharge status: Home / Self Care | Payer: BC Managed Care – PPO | Source: Ambulatory Visit | Attending: Surgery | Admitting: Surgery

## 2019-09-16 DIAGNOSIS — Z20828 Contact with and (suspected) exposure to other viral communicable diseases: Secondary | ICD-10-CM | POA: Diagnosis not present

## 2019-09-16 DIAGNOSIS — Z01812 Encounter for preprocedural laboratory examination: Secondary | ICD-10-CM | POA: Insufficient documentation

## 2019-09-16 LAB — SARS CORONAVIRUS 2 (TAT 6-24 HRS): SARS Coronavirus 2: NEGATIVE

## 2019-09-20 ENCOUNTER — Other Ambulatory Visit: Payer: Self-pay

## 2019-09-20 ENCOUNTER — Ambulatory Visit
Admission: RE | Admit: 2019-09-20 | Discharge: 2019-09-20 | Disposition: A | Discharge status: Home / Self Care | Payer: BC Managed Care – PPO | Attending: Surgery | Admitting: Surgery

## 2019-09-20 ENCOUNTER — Ambulatory Visit: Payer: BC Managed Care – PPO | Admitting: Registered Nurse

## 2019-09-20 ENCOUNTER — Ambulatory Visit: Payer: BC Managed Care – PPO

## 2019-09-20 ENCOUNTER — Encounter: Payer: Self-pay | Admitting: *Deleted

## 2019-09-20 ENCOUNTER — Encounter
Admission: RE | Disposition: A | Discharge status: Home / Self Care | Payer: Self-pay | Source: Home / Self Care | Attending: Surgery

## 2019-09-20 DIAGNOSIS — M7522 Bicipital tendinitis, left shoulder: Secondary | ICD-10-CM | POA: Insufficient documentation

## 2019-09-20 DIAGNOSIS — M778 Other enthesopathies, not elsewhere classified: Secondary | ICD-10-CM | POA: Insufficient documentation

## 2019-09-20 DIAGNOSIS — I10 Essential (primary) hypertension: Secondary | ICD-10-CM | POA: Diagnosis not present

## 2019-09-20 DIAGNOSIS — K219 Gastro-esophageal reflux disease without esophagitis: Secondary | ICD-10-CM | POA: Insufficient documentation

## 2019-09-20 DIAGNOSIS — S43432A Superior glenoid labrum lesion of left shoulder, initial encounter: Secondary | ICD-10-CM | POA: Insufficient documentation

## 2019-09-20 DIAGNOSIS — X58XXXA Exposure to other specified factors, initial encounter: Secondary | ICD-10-CM | POA: Diagnosis not present

## 2019-09-20 DIAGNOSIS — M7502 Adhesive capsulitis of left shoulder: Secondary | ICD-10-CM | POA: Diagnosis present

## 2019-09-20 HISTORY — PX: CLOSED MANIPULATION SHOULDER WITH STERIOD INJECTION: SHX5611

## 2019-09-20 SURGERY — CLOSED MANIPULATION SHOULDER WITH STEROID INJECTION
Anesthesia: General | Site: Shoulder | Laterality: Left

## 2019-09-20 MED ORDER — FENTANYL CITRATE (PF) 100 MCG/2ML IJ SOLN
INTRAMUSCULAR | Status: AC
  Filled 2019-09-20: qty 2

## 2019-09-20 MED ORDER — TRIAMCINOLONE ACETONIDE 40 MG/ML IJ SUSP
INTRAMUSCULAR | Status: AC
  Filled 2019-09-20: qty 1

## 2019-09-20 MED ORDER — BUPIVACAINE-EPINEPHRINE (PF) 0.25% -1:200000 IJ SOLN
INTRAMUSCULAR | Status: DC | PRN
  Administered 2019-09-20: 9 mL via PERINEURAL

## 2019-09-20 MED ORDER — EPINEPHRINE PF 1 MG/ML IJ SOLN
INTRAMUSCULAR | Status: AC
  Filled 2019-09-20: qty 1

## 2019-09-20 MED ORDER — CHLORHEXIDINE GLUCONATE 4 % EX LIQD: 60.0000 mL | Freq: Once | CUTANEOUS | Status: DC

## 2019-09-20 MED ORDER — ROPIVACAINE HCL 5 MG/ML IJ SOLN
INTRAMUSCULAR | Status: AC
  Filled 2019-09-20: qty 30

## 2019-09-20 MED ORDER — PROPOFOL 10 MG/ML IV BOLUS
INTRAVENOUS | Status: AC
  Filled 2019-09-20: qty 40

## 2019-09-20 MED ORDER — FENTANYL CITRATE (PF) 100 MCG/2ML IJ SOLN
INTRAMUSCULAR | Status: AC
  Administered 2019-09-20: 50 ug via INTRAVENOUS
  Filled 2019-09-20: qty 2

## 2019-09-20 MED ORDER — OXYCODONE HCL 5 MG PO TABS: 5.0000 mg | ORAL_TABLET | ORAL | 0 refills | Status: DC | PRN

## 2019-09-20 MED ORDER — ROPIVACAINE HCL 5 MG/ML IJ SOLN
INTRAMUSCULAR | Status: DC | PRN
  Administered 2019-09-20: 30 mL via PERINEURAL

## 2019-09-20 MED ORDER — IPRATROPIUM-ALBUTEROL 0.5-2.5 (3) MG/3ML IN SOLN: 3.0000 mL | RESPIRATORY_TRACT | Status: DC

## 2019-09-20 MED ORDER — FENTANYL CITRATE (PF) 100 MCG/2ML IJ SOLN
50.0000 ug | Freq: Once | INTRAMUSCULAR | Status: AC
  Administered 2019-09-20: 14:00:00 50 ug via INTRAVENOUS

## 2019-09-20 MED ORDER — ONDANSETRON HCL 4 MG/2ML IJ SOLN: 4.0000 mg | Freq: Once | INTRAMUSCULAR | Status: DC | PRN

## 2019-09-20 MED ORDER — LACTATED RINGERS IV SOLN
INTRAVENOUS | Status: DC
  Administered 2019-09-20: 13:00:00 via INTRAVENOUS

## 2019-09-20 MED ORDER — FENTANYL CITRATE (PF) 100 MCG/2ML IJ SOLN
INTRAMUSCULAR | Status: DC | PRN
  Administered 2019-09-20: 50 ug via INTRAVENOUS

## 2019-09-20 MED ORDER — IPRATROPIUM-ALBUTEROL 0.5-2.5 (3) MG/3ML IN SOLN
3.0000 mL | Freq: Once | RESPIRATORY_TRACT | Status: AC
  Administered 2019-09-20: 15:00:00 3 mL via RESPIRATORY_TRACT

## 2019-09-20 MED ORDER — MIDAZOLAM HCL 2 MG/2ML IJ SOLN
INTRAMUSCULAR | Status: DC | PRN
  Administered 2019-09-20: 2 mg via INTRAVENOUS

## 2019-09-20 MED ORDER — PROPOFOL 10 MG/ML IV BOLUS
INTRAVENOUS | Status: DC | PRN
  Administered 2019-09-20: 80 mg via INTRAVENOUS

## 2019-09-20 MED ORDER — MIDAZOLAM HCL 2 MG/2ML IJ SOLN
INTRAMUSCULAR | Status: AC
  Filled 2019-09-20: qty 2

## 2019-09-20 MED ORDER — TRIAMCINOLONE ACETONIDE 40 MG/ML IJ SUSP
INTRAMUSCULAR | Status: DC | PRN
  Administered 2019-09-20: 40 mg via INTRAMUSCULAR

## 2019-09-20 MED ORDER — LIDOCAINE HCL (PF) 1 % IJ SOLN
INTRAMUSCULAR | Status: DC | PRN
  Administered 2019-09-20: 3 mL

## 2019-09-20 MED ORDER — BUPIVACAINE HCL (PF) 0.25 % IJ SOLN
INTRAMUSCULAR | Status: AC
  Filled 2019-09-20: qty 30

## 2019-09-20 MED ORDER — IPRATROPIUM-ALBUTEROL 0.5-2.5 (3) MG/3ML IN SOLN
RESPIRATORY_TRACT | Status: AC
  Filled 2019-09-20: qty 3

## 2019-09-20 MED ORDER — LIDOCAINE HCL (PF) 1 % IJ SOLN
INTRAMUSCULAR | Status: AC
  Filled 2019-09-20: qty 5

## 2019-09-20 MED ORDER — FENTANYL CITRATE (PF) 100 MCG/2ML IJ SOLN: 25.0000 ug | INTRAMUSCULAR | Status: DC | PRN

## 2019-09-20 MED ORDER — MIDAZOLAM HCL 2 MG/2ML IJ SOLN
1.0000 mg | Freq: Once | INTRAMUSCULAR | Status: AC
  Administered 2019-09-20: 14:00:00 1 mg via INTRAVENOUS

## 2019-09-20 MED ORDER — MIDAZOLAM HCL 2 MG/2ML IJ SOLN
INTRAMUSCULAR | Status: AC
  Administered 2019-09-20: 1 mg via INTRAVENOUS
  Filled 2019-09-20: qty 2

## 2019-09-20 SURGICAL SUPPLY — 8 items
BNDG ADH 2 X3.75 FABRIC TAN LF (GAUZE/BANDAGES/DRESSINGS) ×2 IMPLANT
COVER WAND RF STERILE (DRAPES) ×2 IMPLANT
KIT TURNOVER KIT A (KITS) ×2 IMPLANT
NEEDLE HYPO 21X1.5 SAFETY (NEEDLE) ×2 IMPLANT
PAD ALCOHOL SWAB (MISCELLANEOUS) ×4 IMPLANT
SLING ARM LRG DEEP (SOFTGOODS) ×2 IMPLANT
SLING ARM M TX990204 (SOFTGOODS) IMPLANT
SYR 10ML LL (SYRINGE) ×2 IMPLANT

## 2019-09-20 NOTE — H&P (Signed)
Paper H&P to be scanned into permanent record. H&P reviewed and patient re-examined. No changes. 

## 2019-09-20 NOTE — Anesthesia Postprocedure Evaluation (Signed)
Anesthesia Post Note  Patient: Joseph Booth  Procedure(s) Performed: CLOSED MANIPULATION SHOULDER WITH STEROID INJECTION (Left Shoulder)  Patient location during evaluation: PACU Anesthesia Type: General Level of consciousness: awake and alert Pain management: pain level controlled Vital Signs Assessment: post-procedure vital signs reviewed and stable Respiratory status: spontaneous breathing and respiratory function stable Cardiovascular status: stable Anesthetic complications: no     Last Vitals:  Vitals:   09/20/19 1423 09/20/19 1438  BP: 116/82 111/84  Pulse: 69 67  Resp: 18   Temp: 36.5 C   SpO2: 94% 95%    Last Pain:  Vitals:   09/20/19 1438  TempSrc:   PainSc: 0-No pain                 [name]

## 2019-09-20 NOTE — Op Note (Signed)
09/20/2019  2:28 PM  Patient:   Joseph Booth  Pre-Op Diagnosis:   Secondary adhesive capsulitis, left shoulder.  Post-Op Diagnosis:   Same  Procedure:   Manipulation under anesthesia with steroid injection, left shoulder.  Surgeon:   Pascal Lux, MD  Assistant:   Larrie Kass, PA-S  Anesthesia:   IV sedation with interscalene block placed preoperatively by anesthesiologist.  Findings:   As above. Prior to manipulation, the left shoulder could be forward flexed to 130 and abducted to 125. At 90 of abduction, the shoulder could be externally rotated to 65 and internally rotated to 45. Following manipulation, the shoulder could be forward flexed to 165, abducted to 160 and, at 90 of abduction, externally rotated to 90 and internally rotated to 70.  Complications:   None  EBL:   0 cc  Fluids:   200 cc crystalloid  TT:   None  Drains:   None  Closure:   None  Brief Clinical Note:   The patient is a 58 year old male who is now 3 months status post a left shoulder arthroscopy with debridement, decompression, and biceps tenodesis. Despite extensive physical therapy, the patient continues to have difficulty regaining shoulder range of motion. The patient's history and examination are consistent with adhesive capsulitis. The patient presents at this time for a manipulation under anesthesia with steroid injection of the left shoulder.  Procedure:   The patient underwent placement of an interscalene block in the preoperative holding area before being brought into the operating room and lain in the supine position. After adequate IV sedation was achieved, a timeout was performed to verify the correct surgical site. The left shoulder was gently manipulated in both abduction and external rotation, as well as adduction and internal rotation. Several palpable and audible pops were heard as the scar tissue released, permitting full range of motion of the shoulder. The glenohumeral  joint was injected sterilely using 1 cc of Kenalog-40 and 9 cc of 0.25% Sensorcaine with epinephrine before the patient was placed into a sling. The patient was then awakened and returned to the recovery room in satisfactory condition after tolerating the procedure well.

## 2019-09-20 NOTE — Anesthesia Preprocedure Evaluation (Addendum)
Anesthesia Evaluation  Patient identified by MRN, date of birth, ID band Patient awake    Reviewed: Allergy & Precautions, NPO status , Patient's Chart, lab work & pertinent test results  History of Anesthesia Complications Negative for: history of anesthetic complications  Airway Mallampati: II  TM Distance: >3 FB Neck ROM: Full    Dental no notable dental hx.    Pulmonary neg pulmonary ROS, neg sleep apnea, neg COPD,    breath sounds clear to auscultation- rhonchi (-) wheezing      Cardiovascular hypertension, Pt. on medications (-) CAD, (-) Past MI, (-) Cardiac Stents and (-) CABG  Rhythm:Regular Rate:Normal - Systolic murmurs and - Diastolic murmurs    Neuro/Psych neg Seizures negative neurological ROS  negative psych ROS   GI/Hepatic Neg liver ROS, GERD  ,  Endo/Other  negative endocrine ROSneg diabetes  Renal/GU Renal disease: hx of nephrolithiasis.     Musculoskeletal negative musculoskeletal ROS (+)   Abdominal (+) + obese,   Peds  Hematology negative hematology ROS (+)   Anesthesia Other Findings Past Medical History: No date: Bladder stones 02/2018: Chronic gastritis No date: GERD (gastroesophageal reflux disease) No date: History of gastric ulcer No date: History of kidney stones 02-09-2018   dr [name]: History of stress test     Comment:  normal stress echo, normal RVSF, mild TR No date: Hypertension     Comment:  cardiologist-  dr [name] ([facility]) No date: Mixed hyperlipidemia No date: Nephrolithiasis     Comment:  per renal ultrasound 02-19-2018 right renal stone               nonobstructive No date: Seasonal allergies   Reproductive/Obstetrics                             Anesthesia Physical Anesthesia Plan  ASA: II  Anesthesia Plan: General   Post-op Pain Management:  Regional for Post-op pain   Induction: Intravenous  PONV Risk Score and  Plan: 1 and Propofol infusion  Airway Management Planned: Natural Airway  Additional Equipment:   Intra-op Plan:   Post-operative Plan:   Informed Consent: I have reviewed the patients History and Physical, chart, labs and discussed the procedure including the risks, benefits and alternatives for the proposed anesthesia with the patient or authorized representative who has indicated his/her understanding and acceptance.     Dental advisory given  Plan Discussed with: CRNA and Anesthesiologist  Anesthesia Plan Comments:         Anesthesia Quick Evaluation

## 2019-09-20 NOTE — Discharge Instructions (Addendum)
AMBULATORY SURGERY  DISCHARGE INSTRUCTIONS   1) The drugs that you were given will stay in your system until tomorrow so for the next 24 hours you should not:  A) Drive an automobile B) Make any legal decisions C) Drink any alcoholic beverage   2) You may resume regular meals tomorrow.  Today it is better to start with liquids and gradually work up to solid foods.  You may eat anything you prefer, but it is better to start with liquids, then soup and crackers, and gradually work up to solid foods.   3) Please notify your doctor immediately if you have any unusual bleeding, trouble breathing, redness and pain at the surgery site, drainage, fever, or pain not relieved by medication.    4) Additional Instructions:        Please contact your physician with any problems or Same Day Surgery at [phone], Monday through Friday 6 am to 4 pm, or [place] at [facility] number at [phone].Orthopedic discharge instructions: Wear sling until block wears off. May shower once block wears off. Apply ice to shoulder frequently. Take ibuprofen 600-800 mg TID with meals for 7-10 days, then as necessary. Take ES Tylenol or pain medication as prescribed when needed.  Start PT tomorrow as scheduled. Return for follow-up in 10-14 days or as scheduled.

## 2019-09-20 NOTE — Transfer of Care (Signed)
Immediate Anesthesia Transfer of Care Note  Patient: Joseph Booth  Procedure(s) Performed: CLOSED MANIPULATION SHOULDER WITH STEROID INJECTION (Left Shoulder)  Patient Location: PACU  Anesthesia Type:General  Level of Consciousness: awake, alert  and oriented  Airway & Oxygen Therapy: Patient connected to face mask oxygen  Post-op Assessment: Post -op Vital signs reviewed and stable  Post vital signs: stable  Last Vitals:  Vitals Value Taken Time  BP 116/82 09/20/19 1423  Temp 36.5 C 09/20/19 1423  Pulse 69 09/20/19 1423  Resp 18 09/20/19 1423  SpO2 94 % 09/20/19 1423    Last Pain:  Vitals:   09/20/19 1327  TempSrc:   PainSc: 3          Complications: No apparent anesthesia complications

## 2019-09-20 NOTE — Anesthesia Procedure Notes (Signed)
Anesthesia Regional Block: Interscalene brachial plexus block   Pre-Anesthetic Checklist: ,, timeout performed, Correct Patient, Correct Site, Correct Laterality, Correct Procedure, Correct Position, site marked, Risks and benefits discussed,  Surgical consent,  Pre-op evaluation,  At surgeon's request and post-op pain management  Laterality: Left  Prep: chloraprep       Needles:  Injection technique: Single-shot  Needle Type: Stimiplex     Needle Length: 10cm  Needle Gauge: 21     Additional Needles:   Procedures:,,,, ultrasound used (permanent image in chart),,,,  Narrative:  Start time: 09/20/2019 1:30 PM End time: 09/20/2019 1:37 PM Injection made incrementally with aspirations every 5 mL.  Performed by: Personally  Anesthesiologist: Emmie Niemann, MD  Additional Notes: Functioning IV was confirmed and monitors were applied.  A Stimuplex needle was used. Sterile prep and drape,hand hygiene and sterile gloves were used.  Negative aspiration and negative test dose prior to incremental administration of local anesthetic. The patient tolerated the procedure well.

## 2019-09-20 NOTE — Progress Notes (Signed)
Dr. Ronelle Nigh aware oxygen between 91 and 94 now, ox with proceeding to post op for discharge.  Send home with incentive spirometer.

## 2019-09-20 NOTE — Progress Notes (Signed)
Dr. Ronelle Nigh aware of oxygen between 89 and 94 percent on room air.  Patient came into same day with room air oxygen saturation of 94.  May give neb. Treatment.

## 2019-09-20 NOTE — Anesthesia Post-op Follow-up Note (Signed)
Anesthesia QCDR form completed.        

## 2019-09-21 ENCOUNTER — Encounter: Payer: Self-pay | Admitting: Surgery

## 2019-09-21 ENCOUNTER — Ambulatory Visit (INDEPENDENT_AMBULATORY_CARE_PROVIDER_SITE_OTHER): Payer: BC Managed Care – PPO | Admitting: Family Medicine

## 2019-09-21 DIAGNOSIS — L309 Dermatitis, unspecified: Secondary | ICD-10-CM | POA: Diagnosis not present

## 2019-09-21 DIAGNOSIS — L299 Pruritus, unspecified: Secondary | ICD-10-CM | POA: Diagnosis not present

## 2019-09-21 DIAGNOSIS — I10 Essential (primary) hypertension: Secondary | ICD-10-CM

## 2019-09-21 MED ORDER — TRIAMCINOLONE ACETONIDE 0.1 % EX CREA: 1.0000 "application " | TOPICAL_CREAM | Freq: Two times a day (BID) | CUTANEOUS | 0 refills | Status: DC

## 2019-09-21 MED ORDER — HYDROXYZINE HCL 10 MG PO TABS: 10.0000 mg | ORAL_TABLET | Freq: Three times a day (TID) | ORAL | 2 refills | Status: DC | PRN

## 2019-09-21 MED ORDER — PREDNISONE 10 MG (21) PO TBPK: ORAL_TABLET | ORAL | 0 refills | Status: DC

## 2019-09-21 NOTE — Progress Notes (Signed)
Patient ID: Joseph Booth, male   DOB: [birth year], 58 y.o.   MRN: [id number]    Virtual Visit via video Note  This visit type was conducted due to national recommendations for restrictions regarding the COVID-19 pandemic (e.g. social distancing).  This format is felt to be most appropriate for this patient at this time.  All issues noted in this document were discussed and addressed.  No physical exam was performed (except for noted visual exam findings with Video Visits).   I connected with Joseph Booth today at  8:20 AM EST by a video enabled telemedicine application or telephone and verified that I am speaking with the correct person using two identifiers. Location patient: home Location provider: work or home office Persons participating in the virtual visit: patient, provider  I discussed the limitations, risks, security and privacy concerns of performing an evaluation and management service by video and the availability of in person appointments. I also discussed with the patient that there may be a patient responsible charge related to this service. The patient expressed understanding and agreed to proceed.   HPI:  Patient and I connected via video due to complaints of rash on right upper leg.  Patient states he was seen in urgent care and diagnosed with possible scabies.  States he did receive his treatment, but rash persists.  States it can be very itchy at times, he has scratched himself to the point of bleeding.  Rash does not have any blisters or pustules.  No fever or chills.  No known new soaps, detergents, lotions.  Patient also concerned about BP.  States his blood pressure has been consistently staying in the 130s over 80s even though he is taking his amlodipine.  No chest pain, shortness breath, palpitations. No feeling faint or dizzy.   ROS: See pertinent positives and negatives per HPI.  Past Medical History:  Diagnosis Date  . Bladder stones   . Chronic gastritis 02/2018   . GERD (gastroesophageal reflux disease)   . History of gastric ulcer   . History of kidney stones   . History of stress test 02-09-2018   dr [name]   normal stress echo, normal RVSF, mild TR  . Hypertension    cardiologist-  dr [name] ([facility] [place])  . Mixed hyperlipidemia   . Nephrolithiasis    per renal ultrasound 02-19-2018 right renal stone nonobstructive  . Seasonal allergies     Past Surgical History:  Procedure Laterality Date  . CLOSED MANIPULATION SHOULDER WITH STERIOD INJECTION Left 09/20/2019   Procedure: CLOSED MANIPULATION SHOULDER WITH STEROID INJECTION;  Surgeon: Corky Mull, MD;  Location: [facility];  Service: Orthopedics;  Laterality: Left;  . COLONOSCOPY  last one 2016  . CYSTOSCOPY WITH LITHOLAPAXY Right 07/14/2018   Procedure: CYSTOSCOPY WITH RIGHT RETROGRADE PYELOGRAM, RIGHT URETEROSCOPY WITH LASER LITHOTRIPSY AND STONE BASKETTING AND STENT PLACEMENT;  Surgeon: [name], MD;  Location: [facility];  Service: Urology;  Laterality: Right;  . EYE SURGERY     lasik  . SHOULDER ARTHROSCOPY WITH OPEN ROTATOR CUFF REPAIR Left 04/21/2019   Procedure: SHOULDER ARTHROSCOPY WITH OPEN ROTATOR CUFF REPAIR;  Surgeon: Corky Mull, MD;  Location: [facility];  Service: Orthopedics;  Laterality: Left;  . TONSILLECTOMY  1969  . TYMPANOPLASTY Right 2000  . UPPER GASTROINTESTINAL ENDOSCOPY  last one 02-24-2018  . WRIST GANGLION EXCISION Left ?    Family History  Problem Relation Age of Onset  . Alcohol abuse Mother   . Mental  illness Sister   . Squamous cell carcinoma Father   . Colon cancer Neg Hx   . Colon polyps Neg Hx   . Esophageal cancer Neg Hx   . Stomach cancer Neg Hx   . Rectal cancer Neg Hx    Social History   Tobacco Use  . Smoking status: Never Smoker  . Smokeless tobacco: Never Used  Substance Use Topics  . Alcohol use: Yes    Alcohol/week: 0.0 - 1.0 standard drinks    Comment: occasional     Current  Outpatient Medications:  .  amLODipine (NORVASC) 10 MG tablet, TAKE 1 TABLET EVERY DAY (Patient taking differently: Take 10 mg by mouth every morning. ), Disp: 90 tablet, Rfl: 1 .  cetirizine (ZYRTEC) 10 MG tablet, Take 10 mg by mouth daily as needed for allergies. , Disp: , Rfl:  .  esomeprazole (NEXIUM) 20 MG capsule, Take 20-40 mg by mouth daily as needed (acid reflux)., Disp: , Rfl:  .  ibuprofen (ADVIL) 200 MG tablet, Take 200-400 mg by mouth every 8 (eight) hours as needed for moderate pain. , Disp: , Rfl:  .  oxyCODONE (ROXICODONE) 5 MG immediate release tablet, Take 1-2 tablets (5-10 mg total) by mouth every 4 (four) hours as needed for moderate pain or severe pain., Disp: 20 tablet, Rfl: 0 .  Ascorbic Acid (SM CHEWABLE VITAMIN C) 500 MG CHEW, Chew 2 each by mouth daily., Disp: , Rfl:   EXAM:  GENERAL: alert, oriented, appears well and in no acute distress  HEENT: atraumatic, conjunttiva clear, no obvious abnormalities on inspection of external nose and ears  NECK: normal movements of the head and neck  LUNGS: on inspection no signs of respiratory distress, breathing rate appears normal, no obvious gross SOB, gasping or wheezing  CV: no obvious cyanosis  MS: moves all visible extremities without noticeable abnormality  SKIN: Slightly red raised bumpy rash right upper thigh.  Some scabbed areas present, these patient scratching self did bleed.  No signs of infection seen.  PSYCH/NEURO: pleasant and cooperative, no obvious depression or anxiety, speech and thought processing grossly intact  ASSESSMENT AND PLAN:  Discussed the following assessment and plan:  Dermatitis - Plan: predniSONE (STERAPRED UNI-PAK 21 TAB) 10 MG (21) TBPK tablet, triamcinolone cream (KENALOG) 0.1 %, hydrOXYzine (ATARAX/VISTARIL) 10 MG tablet  Itching - Plan: predniSONE (STERAPRED UNI-PAK 21 TAB) 10 MG (21) TBPK tablet, triamcinolone cream (KENALOG) 0.1 %, hydrOXYzine (ATARAX/VISTARIL) 10 MG tablet   Essential hypertension  Unclear reason for patient's rash.  We will treat with steroid oral taper, topical steroid cream will use hydroxyzine as needed for itching.  If rash continues to persist even after the treatment plan, next step in plan of care is dermatology referral.  Patient advised to keep a log of BP readings over the next 1 to 2 weeks and send a MyChart message outlining these readings.  He may need to return to clinic or follow-up with PCP in regards to BP medicine/any adjustments that could be needed.   I discussed the assessment and treatment plan with the patient. The patient was provided an opportunity to ask questions and all were answered. The patient agreed with the plan and demonstrated an understanding of the instructions.   The patient was advised to call back or seek an in-person evaluation if the symptoms worsen or if the condition fails to improve as anticipated.  Jodelle Green, FNP

## 2019-11-10 IMAGING — CR DG ABDOMEN 1V
1 series · 2 of 2 positions shown · non-contrast
Comparison: None.

CLINICAL DATA: Right-sided flank pain for 2 weeks

EXAM:
ABDOMEN - 1 VIEW

[Series 1: dg abd 1 view · 0.14mm/px · 2 of 2 slices shown]
[im 1/2]
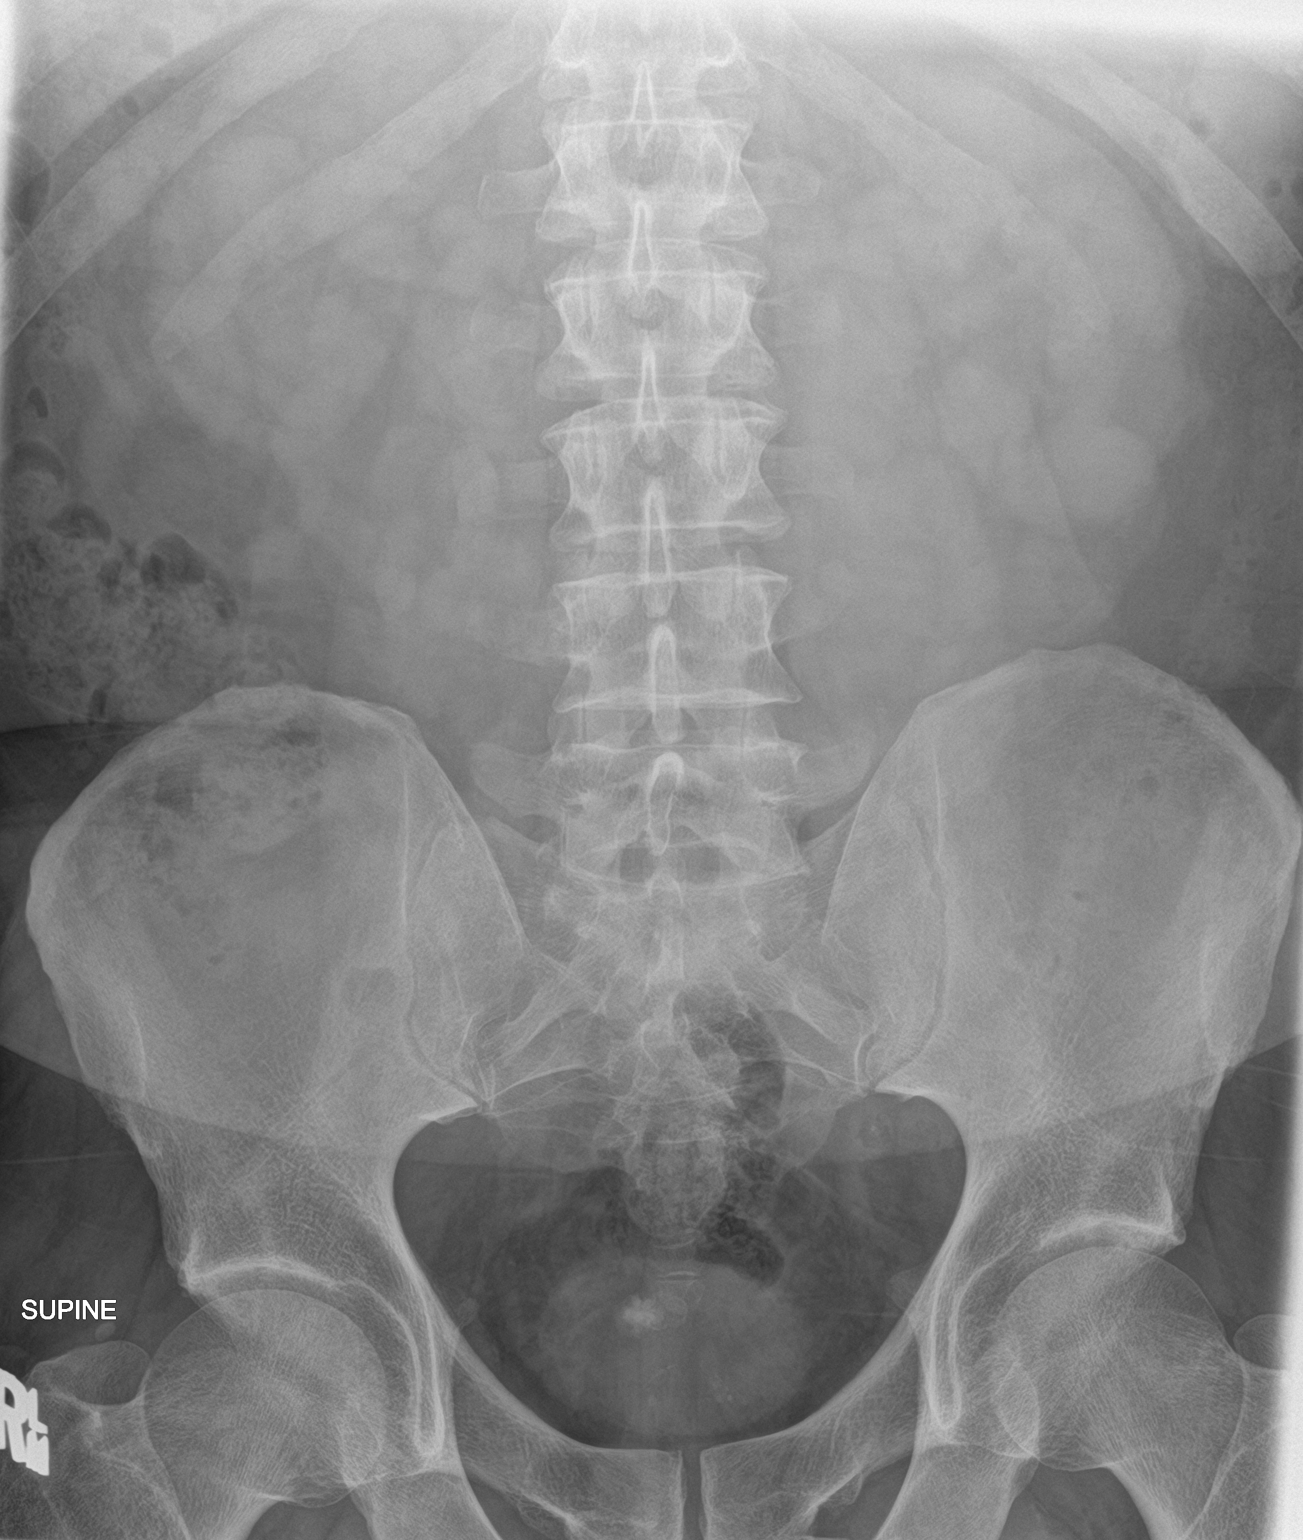
[im 2/2]
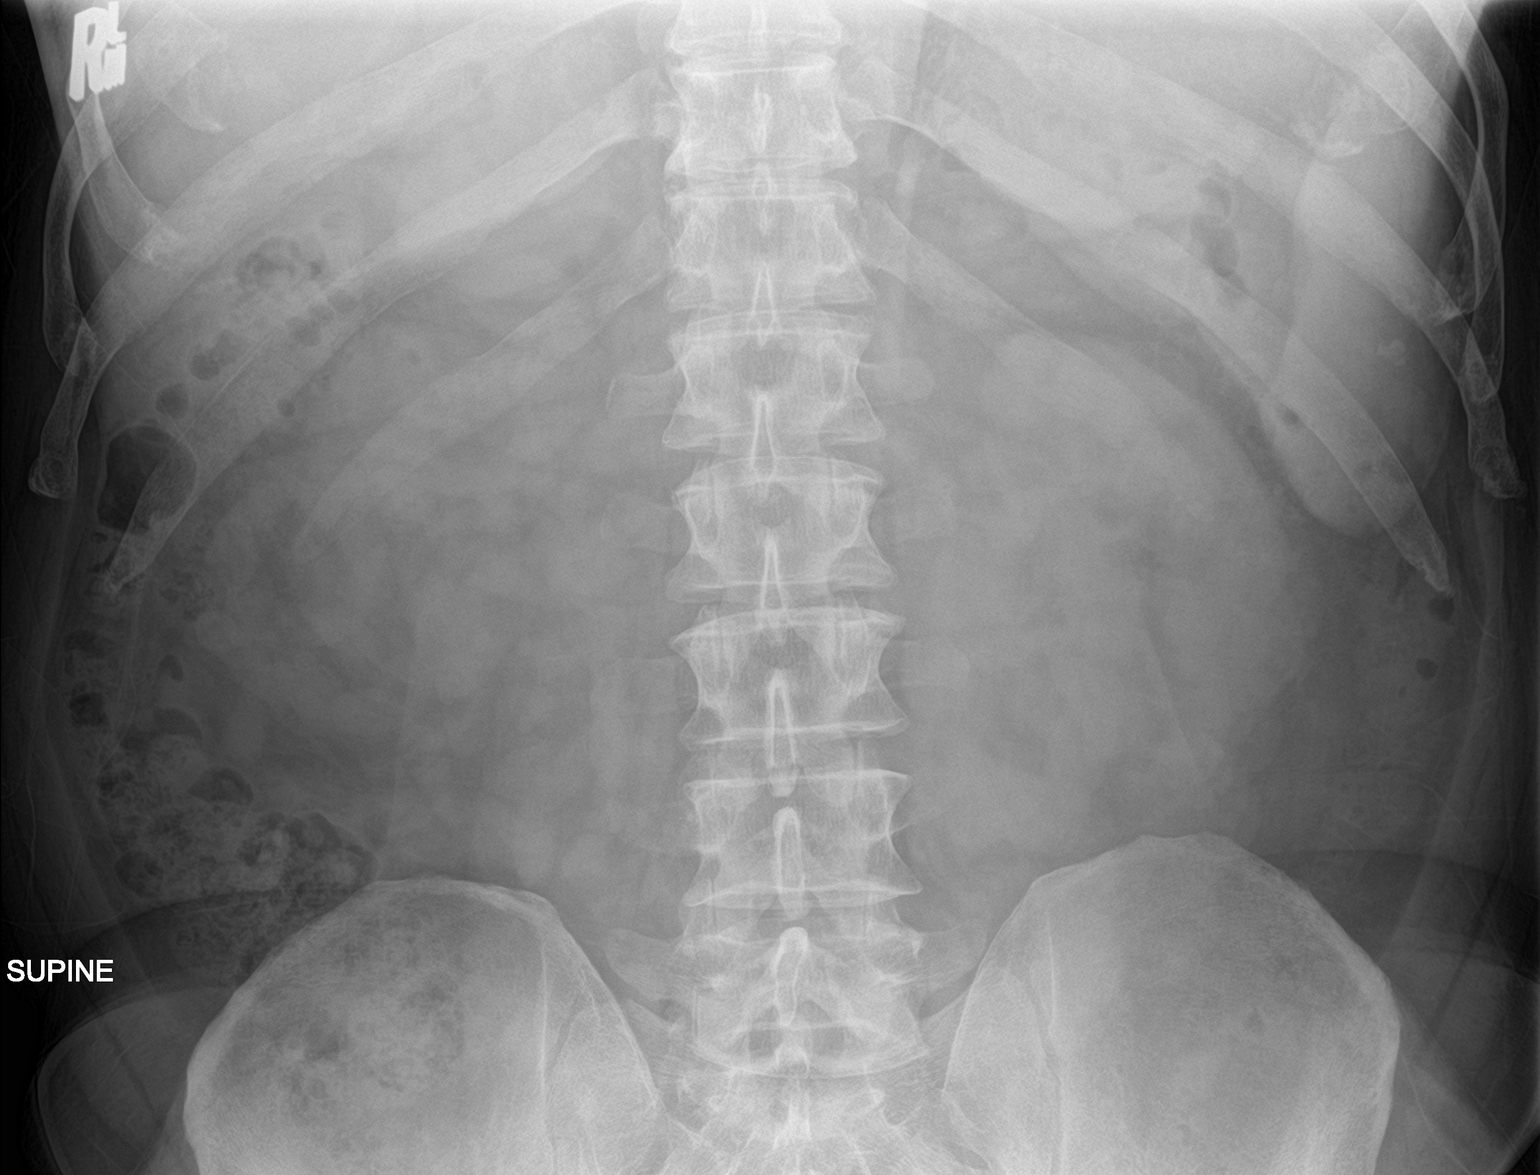

[2 of 2 positions shown; findings below may reference images not displayed]

FINDINGS: Bladder calculus is noted similar to that seen on recent ultrasound
examination. The stone seen on recent ultrasound in the right kidney
is not well appreciated. No obstructive changes are seen within the
bowel. No acute bony abnormality is noted.
IMPRESSION: Bladder calculus similar to that seen on recent ultrasound. Known
right renal stone is not well appreciated on this exam.

## 2019-12-26 ENCOUNTER — Encounter: Payer: Self-pay | Admitting: Family Medicine

## 2020-01-02 ENCOUNTER — Encounter: Payer: Self-pay | Admitting: Family Medicine

## 2020-01-02 ENCOUNTER — Other Ambulatory Visit: Payer: Self-pay

## 2020-01-02 ENCOUNTER — Ambulatory Visit (INDEPENDENT_AMBULATORY_CARE_PROVIDER_SITE_OTHER): Payer: BC Managed Care – PPO | Admitting: Family Medicine

## 2020-01-02 VITALS — BP 140/80 | HR 83 | Temp 96.9°F | Ht 71.0 in | Wt 238.8 lb

## 2020-01-02 DIAGNOSIS — L989 Disorder of the skin and subcutaneous tissue, unspecified: Secondary | ICD-10-CM

## 2020-01-02 DIAGNOSIS — R0781 Pleurodynia: Secondary | ICD-10-CM

## 2020-01-02 DIAGNOSIS — L821 Other seborrheic keratosis: Secondary | ICD-10-CM

## 2020-01-02 DIAGNOSIS — I10 Essential (primary) hypertension: Secondary | ICD-10-CM

## 2020-01-02 DIAGNOSIS — R42 Dizziness and giddiness: Secondary | ICD-10-CM | POA: Diagnosis not present

## 2020-01-02 MED ORDER — AMLODIPINE BESYLATE 10 MG PO TABS: 10.0000 mg | ORAL_TABLET | Freq: Every day | ORAL | 1 refills | Status: DC

## 2020-01-02 NOTE — Progress Notes (Signed)
Tommi Rumps, MD Phone: [phone]  Joseph Booth is a 59 y.o. male who presents today for f/u.  HYPERTENSION  Disease Monitoring  Home BP Monitoring can run into the 140s over 80s chest pain-no    dyspnea-no Medications  Compliance-taking amlodipine.  Edema-yes with the amlodipine  Vertigo: Patient notes he developed ear popping in his right ear on Saturday and then on Sunday developed vertigo with dizziness.  Improved by the afternoon.  He did have a fall related to this and struck his right ribs.  No head injury or loss of consciousness.  He has had no shortness of breath or hemoptysis.  The dizziness would occur with quick head movements though is better today.  No fevers.  Skin lesion: Patient notes a skin lesion over his right forehead that has been present for a long time as a birthmark though over the last year there is an area that is become more sensitive and will become irritated and then clears up and then recur.  He also notes what appears to be an SK over his left nose proximally.    Social History   Tobacco Use  Smoking Status Never Smoker  Smokeless Tobacco Never Used     ROS see history of present illness  Objective  Physical Exam Vitals:   01/02/20 1549  BP: 140/80  Pulse: 83  Temp: (!) 96.9 F (36.1 C)  SpO2: 95%    BP Readings from Last 3 Encounters:  01/02/20 140/80  09/20/19 118/82  04/21/19 123/79   Wt Readings from Last 3 Encounters:  01/02/20 238 lb 12.8 oz (108.3 kg)  09/20/19 230 lb (104.3 kg)  04/21/19 234 lb (106.1 kg)    Physical Exam Constitutional:      General: He is not in acute distress.    Appearance: He is not diaphoretic.  HENT:     Right Ear: Tympanic membrane normal.     Left Ear: Tympanic membrane normal.  Cardiovascular:     Rate and Rhythm: Normal rate and regular rhythm.     Heart sounds: Normal heart sounds.  Pulmonary:     Effort: Pulmonary effort is normal.     Breath sounds: Normal breath sounds.    Musculoskeletal:     Right lower leg: No edema.     Left lower leg: No edema.     Comments: Right ribs with no bruising or bony defects, minimal tenderness of the right mid ribs in the axillary distribution, no flail chest  Skin:    General: Skin is warm and dry.  Neurological:     Mental Status: He is alert.          Assessment/Plan: Please see individual problem list.  Essential hypertension Will obtain lab work and then consider changing him to something different than the amlodipine given swelling and poorly controlled blood pressure.  He will continue his amlodipine until we have results.  Seborrheic keratosis Refer to dermatology to consider removal.  Skin lesion of face Concerning for basal cell or squamous cell.  Refer to dermatology.  Vertigo Resolved.  Potentially related to some allergy issues.  He will monitor for recurrence.  Advised to seek medical attention for any persistent vertigo.  Rib pain on right side Relatively benign exam.  Offered x-ray though the patient and I opted to defer this currently given lack of exam findings and clear lungs.  Advised to take deep breaths frequently.  Discussed if he develops worsening pain or sudden onset shortness of breath he  should seek medical attention immediately.   Orders Placed This Encounter  Procedures  . Comp Met (CMET)  . Lipid panel  . HgB A1c  . Ambulatory referral to Dermatology    Referral Priority:   Routine    Referral Type:   Consultation    Referral Reason:   Specialty Services Required    Requested Specialty:   Dermatology    Number of Visits Requested:   1    Meds ordered this encounter  Medications  . amLODipine (NORVASC) 10 MG tablet    Sig: Take 1 tablet (10 mg total) by mouth daily.    Dispense:  90 tablet    Refill:  1    This visit occurred during the SARS-CoV-2 public health emergency.  Safety protocols were in place, including screening questions prior to the visit, additional  usage of staff PPE, and extensive cleaning of exam room while observing appropriate contact time as indicated for disinfecting solutions.    Tommi Rumps, MD [place]

## 2020-01-02 NOTE — Patient Instructions (Signed)
Nice to see you. We will check labs.  Once these return we will likely change your blood pressure medicine. I have referred you to dermatology. Please monitor your ribs.  If you have worsening pain or trouble breathing please go to emergency room.  If you develop fever please contact us.

## 2020-01-03 DIAGNOSIS — R42 Dizziness and giddiness: Secondary | ICD-10-CM | POA: Insufficient documentation

## 2020-01-03 DIAGNOSIS — R0781 Pleurodynia: Secondary | ICD-10-CM | POA: Insufficient documentation

## 2020-01-03 DIAGNOSIS — L989 Disorder of the skin and subcutaneous tissue, unspecified: Secondary | ICD-10-CM | POA: Insufficient documentation

## 2020-01-03 DIAGNOSIS — L821 Other seborrheic keratosis: Secondary | ICD-10-CM | POA: Insufficient documentation

## 2020-01-03 HISTORY — DX: Disorder of the skin and subcutaneous tissue, unspecified: L98.9

## 2020-01-03 LAB — LIPID PANEL
Cholesterol: 180 mg/dL (ref 0–200)
HDL: 28.4 mg/dL — ABNORMAL LOW (ref >39.00)
LDL Cholesterol: 124 mg/dL — ABNORMAL HIGH (ref 0–99)
NonHDL: 152.08
Total CHOL/HDL Ratio: 6
Triglycerides: 141 mg/dL (ref 0.0–149.0)
VLDL: 28.2 mg/dL (ref 0.0–40.0)

## 2020-01-03 LAB — COMPREHENSIVE METABOLIC PANEL
ALT: 17 U/L (ref 0–53)
AST: 16 U/L (ref 0–37)
Albumin: 4.7 g/dL (ref 3.5–5.2)
Alkaline Phosphatase: 65 U/L (ref 39–117)
BUN: 15 mg/dL (ref 6–23)
CO2: 29 mEq/L (ref 19–32)
Calcium: 9.3 mg/dL (ref 8.4–10.5)
Chloride: 105 mEq/L (ref 96–112)
Creatinine, Ser: 1.03 mg/dL (ref 0.40–1.50)
GFR: 74.64 mL/min (ref >60.00)
Glucose, Bld: 114 mg/dL — ABNORMAL HIGH (ref 70–99)
Potassium: 3.8 mEq/L (ref 3.5–5.1)
Sodium: 139 mEq/L (ref 135–145)
Total Bilirubin: 1.1 mg/dL (ref 0.2–1.2)
Total Protein: 7.2 g/dL (ref 6.0–8.3)

## 2020-01-03 LAB — HEMOGLOBIN A1C: Hgb A1c MFr Bld: 4.7 % (ref 4.6–6.5)

## 2020-01-03 NOTE — Assessment & Plan Note (Addendum)
Relatively benign exam.  Offered x-ray though the patient and I opted to defer this currently given lack of exam findings and clear lungs.  Advised to take deep breaths frequently.  Discussed if he develops worsening pain or sudden onset shortness of breath he should seek medical attention immediately.

## 2020-01-03 NOTE — Assessment & Plan Note (Signed)
Refer to dermatology to consider removal.

## 2020-01-03 NOTE — Assessment & Plan Note (Signed)
Resolved.  Potentially related to some allergy issues.  He will monitor for recurrence.  Advised to seek medical attention for any persistent vertigo.

## 2020-01-03 NOTE — Assessment & Plan Note (Addendum)
Will obtain lab work and then consider changing him to something different than the amlodipine given swelling and poorly controlled blood pressure.  He will continue his amlodipine until we have results.

## 2020-01-03 NOTE — Assessment & Plan Note (Signed)
Concerning for basal cell or squamous cell.  Refer to dermatology.

## 2020-01-10 ENCOUNTER — Other Ambulatory Visit: Payer: Self-pay

## 2020-01-10 MED ORDER — ROSUVASTATIN CALCIUM 20 MG PO TABS: 20.0000 mg | ORAL_TABLET | Freq: Every day | ORAL | 3 refills | Status: DC

## 2020-01-17 ENCOUNTER — Telehealth: Payer: Self-pay | Admitting: Family Medicine

## 2020-01-17 ENCOUNTER — Telehealth: Payer: Self-pay

## 2020-01-17 DIAGNOSIS — I10 Essential (primary) hypertension: Secondary | ICD-10-CM

## 2020-01-17 MED ORDER — LOSARTAN POTASSIUM 50 MG PO TABS: 50.0000 mg | ORAL_TABLET | Freq: Every day | ORAL | 1 refills | Status: DC

## 2020-01-17 NOTE — Telephone Encounter (Signed)
-----   Message from Leone Haven, MD sent at 01/15/2020  9:50 AM EST ----- We can place him on losartan 50 mg once daily by mouth. He needs to have a bmet completed in 7-10 days after starting this medication to recheck his kidney function and electrolytes. He can stop the amlodipine given his swelling. He needs a BP visit with me in about a month after making this change.

## 2020-01-17 NOTE — Telephone Encounter (Signed)
Pt returned your call regarding lab results. He would like a call back.

## 2020-01-18 NOTE — Telephone Encounter (Signed)
Called and spoke with the patient and gave lab results.  Joseph Booth,cma

## 2020-01-19 NOTE — Addendum Note (Signed)
Addended by: Fulton Mole D on: 01/19/2020 10:25 AM   Modules accepted: Orders

## 2020-01-27 ENCOUNTER — Other Ambulatory Visit (INDEPENDENT_AMBULATORY_CARE_PROVIDER_SITE_OTHER): Payer: BC Managed Care – PPO

## 2020-01-27 ENCOUNTER — Other Ambulatory Visit: Payer: Self-pay

## 2020-01-27 DIAGNOSIS — I10 Essential (primary) hypertension: Secondary | ICD-10-CM | POA: Diagnosis not present

## 2020-01-27 LAB — BASIC METABOLIC PANEL
BUN: 12 mg/dL (ref 6–23)
CO2: 31 mEq/L (ref 19–32)
Calcium: 9.2 mg/dL (ref 8.4–10.5)
Chloride: 104 mEq/L (ref 96–112)
Creatinine, Ser: 1.21 mg/dL (ref 0.40–1.50)
GFR: 61.97 mL/min (ref >60.00)
Glucose, Bld: 125 mg/dL — ABNORMAL HIGH (ref 70–99)
Potassium: 3.9 mEq/L (ref 3.5–5.1)
Sodium: 140 mEq/L (ref 135–145)

## 2020-02-20 ENCOUNTER — Other Ambulatory Visit: Payer: Self-pay

## 2020-02-20 ENCOUNTER — Ambulatory Visit (INDEPENDENT_AMBULATORY_CARE_PROVIDER_SITE_OTHER): Payer: BC Managed Care – PPO | Admitting: Dermatology

## 2020-02-20 DIAGNOSIS — L578 Other skin changes due to chronic exposure to nonionizing radiation: Secondary | ICD-10-CM

## 2020-02-20 DIAGNOSIS — C44319 Basal cell carcinoma of skin of other parts of face: Secondary | ICD-10-CM

## 2020-02-20 DIAGNOSIS — C4491 Basal cell carcinoma of skin, unspecified: Secondary | ICD-10-CM

## 2020-02-20 DIAGNOSIS — L820 Inflamed seborrheic keratosis: Secondary | ICD-10-CM

## 2020-02-20 DIAGNOSIS — D485 Neoplasm of uncertain behavior of skin: Secondary | ICD-10-CM

## 2020-02-20 DIAGNOSIS — D1801 Hemangioma of skin and subcutaneous tissue: Secondary | ICD-10-CM

## 2020-02-20 HISTORY — DX: Basal cell carcinoma of skin, unspecified: C44.91

## 2020-02-20 NOTE — Progress Notes (Signed)
   New Patient Visit  Subjective  Joseph Booth is a 59 y.o. male who presents for the following: Other (Bump on right forehead has been there for years but skin over bump has been scbbing for about a year.) and Other (Mole of left nose seems to be bigger). The spot on his forehead is overlying an area where he had a vascular birthmark.  The birthmark has involuted somewhat but there is still a bump there from the original birthmark.   Objective  Well appearing patient in no apparent distress; mood and affect are within normal limits.  A focused examination was performed including face. Relevant physical exam findings are noted in the Assessment and Plan.  Objective  Right Lat Forehead: Red papule.  Objective  Left Nasal Sidewall: Erythematous keratotic or waxy stuck-on papule or plaque.   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       Assessment & Plan    Actinic Damage - diffuse scaly erythematous macules with underlying dyspigmentation - Recommend daily broad spectrum sunscreen SPF 30+ to sun-exposed areas, reapply every 2 hours as needed.  - Call for new or changing lesions.   Hemangioma of skin Right Lat Forehead With persistent soft papule present. Congenital Hemangioma - Benign appearing. Observe.  Inflamed seborrheic keratosis Left Nasal Sidewall  Destruction of lesion - Left Nasal Sidewall Complexity: simple   Destruction method: cryotherapy   Informed consent: discussed and consent obtained   Timeout:  patient name, date of birth, surgical site, and procedure verified Lesion destroyed using liquid nitrogen: Yes   Region frozen until ice ball extended beyond lesion: Yes   Outcome: patient tolerated procedure well with no complications   Post-procedure details: wound care instructions given    Neoplasm of uncertain behavior of skin Right Lat Forehead  Skin / nail biopsy Type of biopsy: tangential     Informed consent: discussed and consent obtained   Timeout: patient name, date of birth, surgical site, and procedure verified   Procedure prep:  Patient was prepped and draped in usual sterile fashion Prep type:  Isopropyl alcohol Anesthesia: the lesion was anesthetized in a standard fashion   Anesthetic:  1% lidocaine w/ epinephrine 1-100,000 nerve block Instrument used: flexible razor blade   Hemostasis achieved with: pressure, aluminum chloride and electrodesiccation   Outcome: patient tolerated procedure well   Post-procedure details: sterile dressing applied and wound care instructions given   Dressing type: bandage and petrolatum    Specimen 1 - Surgical pathology Differential Diagnosis: BCC vs other  Check Margins: No 1.1 x 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   I, Ashok Cordia, CMA, am acting as scribe for Sarina Ser, MD . Documentation: I have reviewed the above documentation for accuracy and completeness, and I agree with the above.  Sarina Ser, MD

## 2020-02-20 NOTE — Patient Instructions (Signed)

## 2020-02-21 ENCOUNTER — Encounter: Payer: Self-pay | Admitting: Dermatology

## 2020-02-21 NOTE — Addendum Note (Signed)
Addended by: Ralene Bathe on: 02/21/2020 05:22 PM   Modules accepted: Level of Service

## 2020-02-22 ENCOUNTER — Ambulatory Visit: Payer: BC Managed Care – PPO | Admitting: Family Medicine

## 2020-02-28 ENCOUNTER — Telehealth: Payer: Self-pay

## 2020-02-28 NOTE — Telephone Encounter (Signed)
Discussed biopsy results with pt  °

## 2020-03-07 ENCOUNTER — Other Ambulatory Visit: Payer: Self-pay

## 2020-03-07 DIAGNOSIS — C44319 Basal cell carcinoma of skin of other parts of face: Secondary | ICD-10-CM

## 2020-03-07 NOTE — Progress Notes (Signed)
MOHs referral to [facility]. 

## 2020-04-02 ENCOUNTER — Encounter: Payer: Self-pay | Admitting: Family Medicine

## 2020-04-02 ENCOUNTER — Ambulatory Visit (INDEPENDENT_AMBULATORY_CARE_PROVIDER_SITE_OTHER): Payer: BC Managed Care – PPO | Admitting: Family Medicine

## 2020-04-02 ENCOUNTER — Other Ambulatory Visit: Payer: Self-pay

## 2020-04-02 DIAGNOSIS — E785 Hyperlipidemia, unspecified: Secondary | ICD-10-CM | POA: Diagnosis not present

## 2020-04-02 DIAGNOSIS — E1169 Type 2 diabetes mellitus with other specified complication: Secondary | ICD-10-CM | POA: Insufficient documentation

## 2020-04-02 DIAGNOSIS — L989 Disorder of the skin and subcutaneous tissue, unspecified: Secondary | ICD-10-CM | POA: Diagnosis not present

## 2020-04-02 DIAGNOSIS — I10 Essential (primary) hypertension: Secondary | ICD-10-CM | POA: Diagnosis not present

## 2020-04-02 DIAGNOSIS — Z006 Encounter for examination for normal comparison and control in clinical research program: Secondary | ICD-10-CM | POA: Insufficient documentation

## 2020-04-02 DIAGNOSIS — E66811 Obesity, class 1: Secondary | ICD-10-CM | POA: Insufficient documentation

## 2020-04-02 DIAGNOSIS — E669 Obesity, unspecified: Secondary | ICD-10-CM

## 2020-04-02 DIAGNOSIS — E782 Mixed hyperlipidemia: Secondary | ICD-10-CM | POA: Insufficient documentation

## 2020-04-02 NOTE — Assessment & Plan Note (Signed)
Encouraged to start on crestor. Plan for labs at next visit.

## 2020-04-02 NOTE — Progress Notes (Signed)
  Tommi Rumps, MD Phone: [phone]  Joseph Booth is a 59 y.o. male who presents today for f/u.  HYPERTENSION  Disease Monitoring  Home BP Monitoring 120s/75-80 Chest pain- no    Dyspnea- no Medications  Compliance-  Taking losartan. Lightheadedness-  Occasionally occurred when he was working outside and would bend over and stand up quickly. No issues if he stands slowly or is working inside.  Edema- no  HLD: forgot to start the crestor. No claudication.  Obesity: is going to get back to working on diet and exercise once his daughters moves back out of the house. This will occur soon. Does yard work.   BCC: removed by dermatology. He see's them for follow-up in a couple of weeks.   Social History   Tobacco Use  Smoking Status Never Smoker  Smokeless Tobacco Never Used     ROS see history of present illness  Objective  Physical Exam Vitals:   04/02/20 0803  BP: 130/80  Pulse: 75  Temp: 97.6 F (36.4 C)  SpO2: 94%    BP Readings from Last 3 Encounters:  04/02/20 130/80  01/02/20 140/80  09/20/19 118/82   Wt Readings from Last 3 Encounters:  04/02/20 231 lb 12.8 oz (105.1 kg)  01/02/20 238 lb 12.8 oz (108.3 kg)  09/20/19 230 lb (104.3 kg)    Physical Exam Constitutional:      General: He is not in acute distress.    Appearance: He is not diaphoretic.  HENT:     Head:   Cardiovascular:     Rate and Rhythm: Normal rate and regular rhythm.     Heart sounds: Normal heart sounds.  Pulmonary:     Effort: Pulmonary effort is normal.     Breath sounds: Normal breath sounds.  Skin:    General: Skin is warm and dry.  Neurological:     Mental Status: He is alert.      Assessment/Plan: Please see individual problem list.  Essential hypertension Adequate control. Continue losartan. Follow-up in 3 months.   HLD (hyperlipidemia) Encouraged to start on crestor. Plan for labs at next visit.   Skin lesion of face Ended up being a BCC. Healing well. He  will keep his appointment with dermatology.   Obesity (BMI 30.0-34.9) Encouraged diet and exercise.    No orders of the defined types were placed in this encounter.   No orders of the defined types were placed in this encounter.   This visit occurred during the SARS-CoV-2 public health emergency.  Safety protocols were in place, including screening questions prior to the visit, additional usage of staff PPE, and extensive cleaning of exam room while observing appropriate contact time as indicated for disinfecting solutions.    Tommi Rumps, MD [place]

## 2020-04-02 NOTE — Assessment & Plan Note (Signed)
Adequate control. Continue losartan. Follow-up in 3 months.

## 2020-04-02 NOTE — Assessment & Plan Note (Signed)
Encouraged diet and exercise.  

## 2020-04-02 NOTE — Assessment & Plan Note (Signed)
Ended up being a BCC. Healing well. He will keep his appointment with dermatology.

## 2020-04-02 NOTE — Patient Instructions (Signed)
Nice to see you. Please start the crestor. We will see you back in 3 months for a visit and labs.

## 2020-05-31 ENCOUNTER — Other Ambulatory Visit: Payer: Self-pay

## 2020-05-31 ENCOUNTER — Ambulatory Visit: Payer: BC Managed Care – PPO | Admitting: Dermatology

## 2020-05-31 DIAGNOSIS — L820 Inflamed seborrheic keratosis: Secondary | ICD-10-CM

## 2020-05-31 DIAGNOSIS — D1800 Hemangioma unspecified site: Secondary | ICD-10-CM

## 2020-05-31 DIAGNOSIS — B353 Tinea pedis: Secondary | ICD-10-CM | POA: Diagnosis not present

## 2020-05-31 DIAGNOSIS — Z1283 Encounter for screening for malignant neoplasm of skin: Secondary | ICD-10-CM | POA: Diagnosis not present

## 2020-05-31 DIAGNOSIS — D229 Melanocytic nevi, unspecified: Secondary | ICD-10-CM

## 2020-05-31 DIAGNOSIS — L905 Scar conditions and fibrosis of skin: Secondary | ICD-10-CM

## 2020-05-31 DIAGNOSIS — Z85828 Personal history of other malignant neoplasm of skin: Secondary | ICD-10-CM

## 2020-05-31 DIAGNOSIS — L814 Other melanin hyperpigmentation: Secondary | ICD-10-CM

## 2020-05-31 DIAGNOSIS — L578 Other skin changes due to chronic exposure to nonionizing radiation: Secondary | ICD-10-CM

## 2020-05-31 DIAGNOSIS — L821 Other seborrheic keratosis: Secondary | ICD-10-CM

## 2020-05-31 MED ORDER — KETOCONAZOLE 2 % EX CREA: 1.0000 "application " | TOPICAL_CREAM | Freq: Two times a day (BID) | CUTANEOUS | 11 refills | Status: AC

## 2020-05-31 MED ORDER — CICLOPIROX 8 % EX KIT: PACK | CUTANEOUS | 3 refills | Status: DC

## 2020-05-31 NOTE — Patient Instructions (Addendum)

## 2020-05-31 NOTE — Progress Notes (Signed)
Follow-Up Visit   Subjective  Joseph Booth is a 59 y.o. male who presents for the following: TBSE (Area of concern on right lower leg).  Patient presents today for annual TBSE, patient does have an area of concern on his right lower leg, it seems to be getting a little larger and flaky. Patient does have a h/o of [place] on right lat forehead in 02/20/20 . The patient presents for Total-Body Skin Exam (TBSE) for skin cancer screening and mole check.  The following portions of the chart were reviewed this encounter and updated as appropriate:  Tobacco  Allergies  Meds  Problems  Med Hx  Surg Hx  Fam Hx      Review of Systems:  No other skin or systemic complaints except as noted in HPI or Assessment and Plan.  Objective  Well appearing patient in no apparent distress; mood and affect are within normal limits.  A full examination was performed including scalp, head, eyes, ears, nose, lips, neck, chest, axillae, abdomen, back, buttocks, bilateral upper extremities, bilateral lower extremities, hands, feet, fingers, toes, fingernails, and toenails. All findings within normal limits unless otherwise noted below.  Objective  Right Forehead: Well healed scar with no evidence of recurrence.   Objective  Right Lower Leg: Erythematous keratotic or waxy stuck-on papule or plaque.   Objective  Left Axilla: Dyspigmented smooth macule or patch.   Objective  Right Foot - Anterior: Scaling and maceration web spaces and over distal and lateral soles.    Assessment & Plan    History of basal cell carcinoma (BCC) Right Forehead  Was treated with Moh's, also had small lipoma removed under BCC, under the hemangioma at time of Moh's  Inflamed seborrheic keratosis Right Lower Leg  Cryotherapy today Prior to procedure, discussed risks of blister formation, small wound, skin dyspigmentation, or rare scar following cryotherapy.    Destruction of lesion - Right Lower Leg Complexity:  simple   Destruction method: cryotherapy   Informed consent: discussed and consent obtained   Timeout:  patient name, date of birth, surgical site, and procedure verified Lesion destroyed using liquid nitrogen: Yes   Region frozen until ice ball extended beyond lesion: Yes   Outcome: patient tolerated procedure well with no complications   Post-procedure details: wound care instructions given    Scar Left Axilla  Tinea pedis of right foot Right Foot - Anterior  Start Penlac once to twice daily to toenails. Start Ketoconazole cream use once to twice daily as needed  ketoconazole (NIZORAL) 2 % cream - Right Foot - Anterior  Ciclopirox 8 % KIT - Right Foot - Anterior  Skin cancer screening    Lentigines - Scattered tan macules - Discussed due to sun exposure - Benign, observe - Call for any changes  Seborrheic Keratoses - Stuck-on, waxy, tan-brown papules and plaques  - Discussed benign etiology and prognosis. - Observe - Call for any changes  Melanocytic Nevi - Tan-brown and/or pink-flesh-colored symmetric macules and papules - Benign appearing on exam today - Observation - Call clinic for new or changing moles - Recommend daily use of broad spectrum spf 30+ sunscreen to sun-exposed areas.   Hemangiomas - Red papules - Discussed benign nature - Observe - Call for any changes  Actinic Damage - diffuse scaly erythematous macules with underlying dyspigmentation - Recommend daily broad spectrum sunscreen SPF 30+ to sun-exposed areas, reapply every 2 hours as needed.  - Call for new or changing lesions.  Skin cancer screening performed today.  Return in about 1 year (around 05/31/2021) for TBSE.  I, Donzetta Kohut, CMA, am acting as scribe for Sarina Ser, MD . Documentation: I have reviewed the above documentation for accuracy and completeness, and I agree with the above.  Sarina Ser, MD

## 2020-06-03 ENCOUNTER — Encounter: Payer: Self-pay | Admitting: Dermatology

## 2020-06-29 ENCOUNTER — Other Ambulatory Visit: Payer: Self-pay | Admitting: Family Medicine

## 2020-06-29 DIAGNOSIS — I10 Essential (primary) hypertension: Secondary | ICD-10-CM

## 2020-07-04 ENCOUNTER — Ambulatory Visit: Payer: BC Managed Care – PPO | Admitting: Family Medicine

## 2020-11-19 ENCOUNTER — Ambulatory Visit
Admission: RE | Admit: 2020-11-19 | Discharge: 2020-11-19 | Disposition: A | Discharge status: Home / Self Care | Payer: BC Managed Care – PPO | Source: Ambulatory Visit | Attending: Family Medicine | Admitting: Family Medicine

## 2020-11-19 ENCOUNTER — Other Ambulatory Visit: Payer: Self-pay

## 2020-11-19 VITALS — BP 155/96 | HR 74 | Temp 98.6°F | Resp 18

## 2020-11-19 DIAGNOSIS — M5414 Radiculopathy, thoracic region: Secondary | ICD-10-CM

## 2020-11-19 MED ORDER — CYCLOBENZAPRINE HCL 5 MG PO TABS: 5.0000 mg | ORAL_TABLET | Freq: Three times a day (TID) | ORAL | 0 refills | Status: DC | PRN

## 2020-11-19 MED ORDER — PREDNISONE 10 MG (21) PO TBPK: ORAL_TABLET | ORAL | 0 refills | Status: DC

## 2020-11-19 NOTE — Discharge Instructions (Signed)
Medications as prescribed.  Prednisone daily for 6 days with food.  Flexeril for muscle relaxation as needed.  This may make you drowsy.  Recommend alternate heat and ice.  I have included some stretches to do in your discharge instructions. Follow up as needed for continued or worsening symptoms

## 2020-11-19 NOTE — ED Provider Notes (Signed)
Joseph Booth    CSN: [id number] Arrival date & time: 11/19/20  2542      History   Chief Complaint No chief complaint on file.   HPI Joseph Booth is a 60 y.o. male.   Patient has a 60 year old male who presents today for right upper back pain with radiation into right shoulder, right upper arm, right chest area.  He has some associated numbness to the right underarm area along with sensitivity to skin.  This is been present, waxing and waning over the past 6 days.  Has been taking Tylenol, ibuprofen and Aleve without much relief.  Thought he was getting better last night and then woke up this morning with the pain returning.  No rashes or fevers.  No injuries or heavy lifting.  No weakness in the arm, swelling, bruising.      Past Medical History:  Diagnosis Date  . Basal cell carcinoma 02/20/2020   right lat forehead  . Bladder stones   . Chronic gastritis 02/2018  . GERD (gastroesophageal reflux disease)   . History of gastric ulcer   . History of kidney stones   . History of stress test 02-09-2018   dr [name]   normal stress echo, normal RVSF, mild TR  . Hypertension    cardiologist-  dr [name] ([place] )  . Mixed hyperlipidemia   . Nephrolithiasis    per renal ultrasound 02-19-2018 right renal stone nonobstructive  . Seasonal allergies     Patient Active Problem List   Diagnosis Date Noted  . HLD (hyperlipidemia) 04/02/2020  . Obesity (BMI 30.0-34.9) 04/02/2020  . Seborrheic keratosis 01/03/2020  . Skin lesion of face 01/03/2020  . Vertigo 01/03/2020  . Rib pain on right side 01/03/2020  . Calculus of bladder 02/23/2018  . Nephrolithiasis 02/23/2018  . GERD (gastroesophageal reflux disease) 11/04/2017  . Anxiety 11/04/2017  . Essential hypertension 05/04/2016    Past Surgical History:  Procedure Laterality Date  . CLOSED MANIPULATION SHOULDER WITH STERIOD INJECTION Left 09/20/2019   Procedure: CLOSED MANIPULATION SHOULDER WITH  STEROID INJECTION;  Surgeon: Corky Mull, MD;  Location: [facility];  Service: Orthopedics;  Laterality: Left;  . COLONOSCOPY  last one 2016  . CYSTOSCOPY WITH LITHOLAPAXY Right 07/14/2018   Procedure: CYSTOSCOPY WITH RIGHT RETROGRADE PYELOGRAM, RIGHT URETEROSCOPY WITH LASER LITHOTRIPSY AND STONE BASKETTING AND STENT PLACEMENT;  Surgeon: [name], MD;  [facility];  Service: Urology;  Laterality: Right;  . EYE SURGERY     lasik  . SHOULDER ARTHROSCOPY WITH OPEN ROTATOR CUFF REPAIR Left 04/21/2019   Procedure: SHOULDER ARTHROSCOPY WITH OPEN ROTATOR CUFF REPAIR;  Surgeon: Corky Mull, MD;  Location: [facility] OR[facility];  Service: Orthopedics;  Laterality: Left;  . TONSILLECTOMY  1969  . TYMPANOPLASTY Right 2000  . UPPER GASTROINTESTINAL ENDOSCOPY  last one 02-24-2018  . WRIST GANGLION EXCISION Left ?       Home Medications    Prior to Admission medications   Medication Sig Start Date End Date Taking? Authorizing Provider  cyclobenzaprine (FLEXERIL) 5 MG tablet Take 1 tablet (5 mg total) by mouth 3 (three) times daily as needed for muscle spasms. 11/19/20  Yes Shamiracle Gorden A, NP  predniSONE (STERAPRED UNI-PAK 21 TAB) 10 MG (21) TBPK tablet 6 tabs for 1 day, then 5 tabs for 1 das, then 4 tabs for 1 day, then 3 tabs for 1 day, 2 tabs for 1 day, then 1 tab for 1 day 11/19/20  Yes Rozanna Box, [place]  A, NP  Ascorbic Acid (SM CHEWABLE VITAMIN C) 500 MG CHEW Chew 2 each by mouth daily.    [provider]  cetirizine (ZYRTEC) 10 MG tablet Take 10 mg by mouth daily as needed for allergies.     [provider]  Ciclopirox 8 % KIT Apply once to twice daily as needed to toe nails on both feet 05/31/20   Ralene Bathe, MD  esomeprazole (NEXIUM) 20 MG capsule Take 20-40 mg by mouth daily as needed (acid reflux).    [provider]  ibuprofen (ADVIL) 200 MG tablet Take 200-400 mg by mouth every 8 (eight) hours as needed for moderate pain.     [provider]  losartan (COZAAR) 50 MG tablet TAKE 1 TABLET BY MOUTH EVERY DAY 06/29/20   Leone Haven, MD  rosuvastatin (CRESTOR) 20 MG tablet Take 1 tablet (20 mg total) by mouth daily. Patient not taking: Reported on 05/31/2020 01/10/20   Leone Haven, MD    Family History Family History  Problem Relation Age of Onset  . Alcohol abuse Mother   . Mental illness Sister   . Squamous cell carcinoma Father   . Colon cancer Neg Hx   . Colon polyps Neg Hx   . Esophageal cancer Neg Hx   . Stomach cancer Neg Hx   . Rectal cancer Neg Hx     Social History Social History   Tobacco Use  . Smoking status: Never Smoker  . Smokeless tobacco: Never Used  Vaping Use  . Vaping Use: Never used  Substance Use Topics  . Alcohol use: Yes    Alcohol/week: 0.0 - 1.0 standard drinks    Comment: occasional  . Drug use: No     Allergies   Pollen extract   Review of Systems Review of Systems   Physical Exam Triage Vital Signs ED Triage Vitals [11/19/20 0958]  Enc Vitals Group     BP (!) 155/96     Pulse Rate 74     Resp 18     Temp 98.6 F (37 C)     Temp Source Oral     SpO2 93 %     Weight      Height      Head Circumference      Peak Flow      Pain Score      Pain Loc      Pain Edu?      Excl. in [place]?    No data found.  Updated Vital Signs BP (!) 155/96 (BP Location: Left Arm)   Pulse 74   Temp 98.6 F (37 C) (Oral)   Resp 18   SpO2 93%   Visual Acuity Right Eye Distance:   Left Eye Distance:   Bilateral Distance:    Right Eye Near:   Left Eye Near:    Bilateral Near:     Physical Exam Vitals and nursing note reviewed.  Constitutional:      Appearance: Normal appearance.  HENT:     Head: Normocephalic and atraumatic.     Nose: Nose normal.  Eyes:     Conjunctiva/sclera: Conjunctivae normal.  Pulmonary:     Effort: Pulmonary effort is normal.  Musculoskeletal:        General: Normal range of motion.     Cervical back: Normal range of  motion.       Back:     Comments: Normal ROm of the shoulder and arm. No pain  with palpation of the shoulder and chest.  Normal strength.  2+ radial pulse.   Skin:    General: Skin is warm and dry.  Neurological:     Mental Status: He is alert.  Psychiatric:        Mood and Affect: Mood normal.      UC Treatments / Results  Labs (all labs ordered are listed, but only abnormal results are displayed) Labs Reviewed - No data to display  EKG   Radiology No results found.  Procedures Procedures (including critical care time)  Medications Ordered in UC Medications - No data to display  Initial Impression / Assessment and Plan / UC Course  I have reviewed the triage vital signs and the nursing notes.  Pertinent labs & imaging results that were available during my care of the patient were reviewed by me and considered in my medical decision making (see chart for details).     Thoracic nerve root impingement This is most likely the cause of his symptoms. We will treat with prednisone daily for 6 days with food.  Flexeril for muscle relaxation in the upper back muscle that may possibly be spasm. Recommend alternate heat and ice.  Stretches including discharge instructions to perform Follow up as needed for continued or worsening symptoms  Final Clinical Impressions(s) / UC Diagnoses   Final diagnoses:  Thoracic nerve root impingement     Discharge Instructions     Medications as prescribed.  Prednisone daily for 6 days with food.  Flexeril for muscle relaxation as needed.  This may make you drowsy.  Recommend alternate heat and ice.  I have included some stretches to do in your discharge instructions. Follow up as needed for continued or worsening symptoms     ED Prescriptions    Medication Sig Dispense Auth. Provider   predniSONE (STERAPRED UNI-PAK 21 TAB) 10 MG (21) TBPK tablet 6 tabs for 1 day, then 5 tabs for 1 das, then 4 tabs for 1 day, then 3 tabs for 1  day, 2 tabs for 1 day, then 1 tab for 1 day 21 tablet Rydell Wiegel A, NP   cyclobenzaprine (FLEXERIL) 5 MG tablet Take 1 tablet (5 mg total) by mouth 3 (three) times daily as needed for muscle spasms. 30 tablet Loura Halt A, NP     PDMP not reviewed this encounter.   Loura Halt A, NP 11/19/20 1026

## 2020-11-19 NOTE — ED Triage Notes (Signed)
Triage by provider

## 2021-01-02 ENCOUNTER — Other Ambulatory Visit: Payer: Self-pay | Admitting: Family Medicine

## 2021-01-02 DIAGNOSIS — I10 Essential (primary) hypertension: Secondary | ICD-10-CM

## 2021-06-05 ENCOUNTER — Encounter: Payer: BC Managed Care – PPO | Admitting: Dermatology

## 2021-08-20 ENCOUNTER — Ambulatory Visit: Payer: BC Managed Care – PPO

## 2021-08-20 ENCOUNTER — Ambulatory Visit
Admission: EM | Admit: 2021-08-20 | Discharge: 2021-08-20 | Disposition: A | Discharge status: Home / Self Care | Payer: BC Managed Care – PPO | Attending: Emergency Medicine | Admitting: Emergency Medicine

## 2021-08-20 ENCOUNTER — Encounter: Payer: Self-pay | Admitting: Emergency Medicine

## 2021-08-20 ENCOUNTER — Other Ambulatory Visit: Payer: Self-pay

## 2021-08-20 DIAGNOSIS — S39012A Strain of muscle, fascia and tendon of lower back, initial encounter: Secondary | ICD-10-CM

## 2021-08-20 MED ORDER — TIZANIDINE HCL 4 MG PO TABS: 4.0000 mg | ORAL_TABLET | Freq: Three times a day (TID) | ORAL | 0 refills | Status: DC | PRN

## 2021-08-20 MED ORDER — METHYLPREDNISOLONE 4 MG PO TBPK: ORAL_TABLET | Freq: Every day | ORAL | 0 refills | Status: DC

## 2021-08-20 MED ORDER — NAPROXEN 500 MG PO TABS: 500.0000 mg | ORAL_TABLET | Freq: Two times a day (BID) | ORAL | 0 refills | Status: DC

## 2021-08-20 NOTE — ED Triage Notes (Signed)
Pt c/o lower Back Pain x 1.5 weeks.

## 2021-08-20 NOTE — Discharge Instructions (Addendum)
Aleve with 1000 mg of Tylenol twice a day.  Zanaflex for muscle spasm, Medrol Dosepak for inflammation.  May take an additional 1000 mg of Tylenol 2 more times a day.  Epson salt soaks.  Many people find gentle stretching and deep tissue massage helpful. Follow-up with your primary care physician in a week if you are not getting better, go to the ER for the signs and symptoms we discussed.  Go to www.goodrx.com  or www.costplusdrugs.com to look up your medications. This will give you a list of where you can find your prescriptions at the most affordable prices. Or ask the pharmacist what the cash price is, or if they have any other discount programs available to help make your medication more affordable. This can be less expensive than what you would pay with insurance.

## 2021-08-20 NOTE — ED Provider Notes (Signed)
HPI  SUBJECTIVE:  Joseph Booth is a 60 y.o. male who presents with constant, stabbing bilateral low back pain starting a week and half ago after tripping and stumbling forward.  He did a lot of walking this weekend, and states that the back pain got worse after this.  No fevers, urinary complaints, abdominal pain, numbness and tingling in his legs, leg weakness, rash, pain worse at night, unintentional weight loss, night sweats, saddle anesthesia, urinary retention, urinary or fecal incontinence.  No direct trauma to the back.  He has never had symptoms like this before.  He has been alternating 2 Aleve or 800 mg ibuprofen and 1000 mg of Tylenol without improvement in his symptoms.  No antipyretic in the past 6 hours.  Symptoms are better with resting and "doing nothing", worse with bending forward, and any activity.  He has a past medical history of nephrolithiasis and states that this does not feel like that.  He also has a history of hypertension.  No history of diabetes, chronic kidney disease, IVDU, aortic abdominal aneurysm, osteoporosis, chronic steroid use.  [id number], Angela Adam, MD   Past Medical History:  Diagnosis Date   Basal cell carcinoma 02/20/2020   right lat forehead   Bladder stones    Chronic gastritis 02/2018   GERD (gastroesophageal reflux disease)    History of gastric ulcer    History of kidney stones    History of stress test 02-09-2018   dr [name]   normal stress echo, normal RVSF, mild TR   Hypertension    cardiologist-  dr [name] [facility])   Mixed hyperlipidemia    Nephrolithiasis    per renal ultrasound 02-19-2018 right renal stone nonobstructive   Seasonal allergies     Past Surgical History:  Procedure Laterality Date   CLOSED MANIPULATION SHOULDER WITH STERIOD INJECTION Left 09/20/2019   Procedure: CLOSED MANIPULATION SHOULDER WITH STEROID INJECTION;  Surgeon: Corky Mull, MD;  Location: [facility];  Service: Orthopedics;  Laterality: Left;    COLONOSCOPY  last one 2016   CYSTOSCOPY WITH LITHOLAPAXY Right 07/14/2018   Procedure: CYSTOSCOPY WITH RIGHT RETROGRADE PYELOGRAM, RIGHT URETEROSCOPY WITH LASER LITHOTRIPSY AND STONE [place] AND STENT PLACEMENT;  Surgeon: [name], MD;  Location: [facility];  Service: Urology;  Laterality: Right;   EYE SURGERY     lasik   SHOULDER ARTHROSCOPY WITH OPEN ROTATOR CUFF REPAIR Left 04/21/2019   Procedure: SHOULDER ARTHROSCOPY WITH OPEN ROTATOR CUFF REPAIR;  Surgeon: Corky Mull, MD;  Location: [facility];  Service: Orthopedics;  Laterality: Left;   TONSILLECTOMY  1969   TYMPANOPLASTY Right 2000   UPPER GASTROINTESTINAL ENDOSCOPY  last one 02-24-2018   WRIST GANGLION EXCISION Left ?    Family History  Problem Relation Age of Onset   Alcohol abuse Mother    Mental illness Sister    Squamous cell carcinoma Father    Colon cancer Neg Hx    Colon polyps Neg Hx    Esophageal cancer Neg Hx    Stomach cancer Neg Hx    Rectal cancer Neg Hx     Social History   Tobacco Use   Smoking status: Never   Smokeless tobacco: Never  Vaping Use   Vaping Use: Never used  Substance Use Topics   Alcohol use: Yes    Alcohol/week: 0.0 - 1.0 standard drinks    Comment: occasional   Drug use: No    No current facility-administered medications for this encounter.  Current Outpatient  Medications:    Ascorbic Acid 500 MG CHEW, Chew 2 each by mouth daily., Disp: , Rfl:    cetirizine (ZYRTEC) 10 MG tablet, Take 10 mg by mouth daily as needed for allergies. , Disp: , Rfl:    esomeprazole (NEXIUM) 20 MG capsule, Take 20-40 mg by mouth daily as needed (acid reflux)., Disp: , Rfl:    losartan (COZAAR) 50 MG tablet, TAKE 1 TABLET BY MOUTH EVERY DAY, Disp: 90 tablet, Rfl: 1   methylPREDNISolone (MEDROL DOSEPAK) 4 MG TBPK tablet, Take by mouth daily. Follow package instructions, Disp: 21 tablet, Rfl: 0   naproxen (NAPROSYN) 500 MG tablet, Take 1 tablet (500 mg total) by mouth 2  (two) times daily., Disp: 20 tablet, Rfl: 0   tiZANidine (ZANAFLEX) 4 MG tablet, Take 1 tablet (4 mg total) by mouth every 8 (eight) hours as needed for muscle spasms., Disp: 30 tablet, Rfl: 0   Ciclopirox 8 % KIT, Apply once to twice daily as needed to toe nails on both feet, Disp: 34.6 mL, Rfl: 3   rosuvastatin (CRESTOR) 20 MG tablet, Take 1 tablet (20 mg total) by mouth daily. (Patient not taking: Reported on 05/31/2020), Disp: 90 tablet, Rfl: 3  Allergies  Allergen Reactions   Pollen Extract      ROS  As noted in HPI.   Physical Exam  Pulse 77   Temp 99.1 F (37.3 C) (Oral)   SpO2 94%   Constitutional: Well developed, well nourished, no acute distress Eyes:  EOMI, conjunctiva normal bilaterally HENT: Normocephalic, atraumatic,mucus membranes moist Respiratory: Normal inspiratory effort Cardiovascular: Normal rate GI: nondistended. No suprapubic, flank tenderness Musculoskeletal: no CVAT. no paralumbar tenderness, no appreciable muscle spasm. No L-spine, sacral bony tenderness. Bilateral lower extremities nontender, baseline ROM with intact PT pulses. No pain with int/ext rotation flex hips bilaterally.  Pain aggravated with hip extension against resistance bilaterally.  SLR neg bilaterally. Sensation baseline light touch bilaterally for Pt, DTR's symmetric and intact bilaterally KJ, Motor symmetric bilateral 5/5 hip flexion, quadriceps, hamstrings, EHL, foot dorsiflexion, foot plantarflexion, gait somewhat antalgic but without apparent new ataxia.  Pain aggravated with going from sitting to standing. Neurologic: Alert & oriented x 3, no focal neuro deficits Psychiatric: Speech and behavior appropriate   ED Course   Medications - No data to display  No orders of the defined types were placed in this encounter.   No results found for this or any previous visit (from the past 24 hour(s)). No results found.  ED Clinical Impression  1. Acute myofascial strain of lumbar  region, initial encounter     ED Assessment/Plan  Presentation consistent with a lumbar strain. No evidence of spinal cord involvement based on H&P.  Pain has been less than 6 weeks duration. No historical red flags as noted in HPI. No physical red flags such as fever, bony tenderness, lower extremity weakness, saddle anesthesia. Imaging not indicated at this time.  Discussed this with patient, he is amenable to this.  Home with Aleve/Tylenol, Zanaflex, Medrol Dosepak.  Epson salt soaks, deep tissue massage. Pt to f/u with PMD.  Discussed labs, medical decision-making, and plan for follow-up with the patient.  Discussed signs and symptoms that should prompt return to the emergency department.  Patient agrees with plan.  Discussed MDM, treatment plan, and plan for follow-up with patient. Discussed sn/sx that should prompt return to the ED. patient agrees with plan.   Meds ordered this encounter  Medications   methylPREDNISolone (MEDROL DOSEPAK) 4 MG TBPK  tablet    Sig: Take by mouth daily. Follow package instructions    Dispense:  21 tablet    Refill:  0   tiZANidine (ZANAFLEX) 4 MG tablet    Sig: Take 1 tablet (4 mg total) by mouth every 8 (eight) hours as needed for muscle spasms.    Dispense:  30 tablet    Refill:  0   naproxen (NAPROSYN) 500 MG tablet    Sig: Take 1 tablet (500 mg total) by mouth 2 (two) times daily.    Dispense:  20 tablet    Refill:  0    *This clinic note was created using [employer]. Therefore, there may be occasional mistakes despite careful proofreading.  ?     Melynda Ripple, MD 08/21/21 [phone]

## 2021-09-04 ENCOUNTER — Other Ambulatory Visit: Payer: Self-pay | Admitting: Family Medicine

## 2021-09-04 DIAGNOSIS — I10 Essential (primary) hypertension: Secondary | ICD-10-CM

## 2022-03-07 ENCOUNTER — Ambulatory Visit (INDEPENDENT_AMBULATORY_CARE_PROVIDER_SITE_OTHER): Payer: BC Managed Care – PPO | Admitting: Family Medicine

## 2022-03-07 ENCOUNTER — Encounter: Payer: Self-pay | Admitting: Family Medicine

## 2022-03-07 VITALS — BP 130/80 | HR 85 | Temp 98.6°F | Ht 71.0 in | Wt 242.0 lb

## 2022-03-07 DIAGNOSIS — R1011 Right upper quadrant pain: Secondary | ICD-10-CM

## 2022-03-07 DIAGNOSIS — M5430 Sciatica, unspecified side: Secondary | ICD-10-CM | POA: Insufficient documentation

## 2022-03-07 DIAGNOSIS — R109 Unspecified abdominal pain: Secondary | ICD-10-CM | POA: Diagnosis not present

## 2022-03-07 DIAGNOSIS — N23 Unspecified renal colic: Secondary | ICD-10-CM | POA: Diagnosis not present

## 2022-03-07 DIAGNOSIS — M5431 Sciatica, right side: Secondary | ICD-10-CM

## 2022-03-07 DIAGNOSIS — I10 Essential (primary) hypertension: Secondary | ICD-10-CM | POA: Diagnosis not present

## 2022-03-07 DIAGNOSIS — E785 Hyperlipidemia, unspecified: Secondary | ICD-10-CM

## 2022-03-07 LAB — LIPID PANEL
Cholesterol: 176 mg/dL (ref 0–200)
HDL: 30.9 mg/dL — ABNORMAL LOW (ref >39.00)
LDL Cholesterol: 113 mg/dL — ABNORMAL HIGH (ref 0–99)
NonHDL: 145.14
Total CHOL/HDL Ratio: 6
Triglycerides: 162 mg/dL — ABNORMAL HIGH (ref 0.0–149.0)
VLDL: 32.4 mg/dL (ref 0.0–40.0)

## 2022-03-07 LAB — POCT URINALYSIS DIPSTICK
Bilirubin, UA: NEGATIVE
Blood, UA: NEGATIVE
Glucose, UA: NEGATIVE
Nitrite, UA: NEGATIVE
Protein, UA: POSITIVE — AB
Spec Grav, UA: 1.015 (ref 1.010–1.025)
Urobilinogen, UA: 2 E.U./dL — AB
pH, UA: 6.5 (ref 5.0–8.0)

## 2022-03-07 LAB — COMPREHENSIVE METABOLIC PANEL
ALT: 20 U/L (ref 0–53)
AST: 16 U/L (ref 0–37)
Albumin: 4.7 g/dL (ref 3.5–5.2)
Alkaline Phosphatase: 55 U/L (ref 39–117)
BUN: 17 mg/dL (ref 6–23)
CO2: 30 mEq/L (ref 19–32)
Calcium: 9.4 mg/dL (ref 8.4–10.5)
Chloride: 102 mEq/L (ref 96–112)
Creatinine, Ser: 1.13 mg/dL (ref 0.40–1.50)
GFR: 71.7 mL/min (ref >60.00)
Glucose, Bld: 105 mg/dL — ABNORMAL HIGH (ref 70–99)
Potassium: 4.4 mEq/L (ref 3.5–5.1)
Sodium: 139 mEq/L (ref 135–145)
Total Bilirubin: 1 mg/dL (ref 0.2–1.2)
Total Protein: 7 g/dL (ref 6.0–8.3)

## 2022-03-07 LAB — URINALYSIS, MICROSCOPIC ONLY: RBC / HPF: NONE SEEN (ref 0–?)

## 2022-03-07 LAB — HEMOGLOBIN A1C: Hgb A1c MFr Bld: 5.3 % (ref 4.6–6.5)

## 2022-03-07 MED ORDER — TAMSULOSIN HCL 0.4 MG PO CAPS
0.4000 mg | ORAL_CAPSULE | Freq: Every day | ORAL | 0 refills | Status: DC
Start: 2022-03-07 — End: 2022-04-03

## 2022-03-07 NOTE — Patient Instructions (Signed)
Nice to see you. ?Please do the exercises for your sciatica. ?We will contact you with your lab results. ?Someone should contact you to schedule your CT scan. ?Please start the Flomax.  If you get lightheaded with this please discontinue the medication and let us know. ?If you have worsening symptoms related to the kidney stone please seek medical attention immediately. ?

## 2022-03-07 NOTE — Progress Notes (Signed)
?Tommi Rumps, MD ?Phone: [phone] ? ?Joseph Booth is a 61 y.o. male who presents today for f/u. ? ?HYPERTENSION ?Disease Monitoring ?Home BP Monitoring not checking consistently at home, checks at work on the machine and it varies widely Chest pain- no    Dyspnea- no ?Medications ?Compliance-  taking losartan.   Edema- no ?BMET ?   ?Component Value Date/Time  ? NA 140 01/27/2020 0854  ? K 3.9 01/27/2020 0854  ? CL 104 01/27/2020 0854  ? CO2 31 01/27/2020 0854  ? GLUCOSE 125 (H) 01/27/2020 0854  ? BUN 12 01/27/2020 0854  ? CREATININE 1.21 01/27/2020 0854  ? CALCIUM 9.2 01/27/2020 0854  ? GFRNONAA >60 02/03/2018 1828  ? GFRAA >60 02/03/2018 1828  ? ?Right flank pain: This occurred 3 days ago and was a 6 out of 10.  Has improved some over the last few days.  He notes it felt the same as his prior kidney stone.  No hematuria, fevers, dysuria, change in urinary frequency, or urgency.  Reports having a prior surgery for kidney stone. ? ?Sciatica: Patient reports right-sided sciatica-like pain that radiates down into his right hamstring.  Notes it feels like it is a pulled muscle although he has not pulled his right hamstring.  He has altered his seating in his car and that has helped quite a bit.  No loss of bowel or bladder function. ? ?Social History  ? ?Tobacco Use  ?Smoking Status Never  ?Smokeless Tobacco Never  ? ? ?Current Outpatient Medications on File Prior to Visit  ?Medication Sig Dispense Refill  ? Ascorbic Acid 500 MG CHEW Chew 2 each by mouth daily.    ? cetirizine (ZYRTEC) 10 MG tablet Take 10 mg by mouth daily as needed for allergies.     ? esomeprazole (NEXIUM) 20 MG capsule Take 20-40 mg by mouth daily as needed (acid reflux).    ? losartan (COZAAR) 50 MG tablet TAKE 1 TABLET BY MOUTH EVERY DAY 90 tablet 1  ? ?No current facility-administered medications on file prior to visit.  ? ? ? ?ROS see history of present illness ? ?Objective ? ?Physical Exam ?Vitals:  ? 03/07/22 1123  ?BP: 130/80  ?Pulse:  85  ?Temp: 98.6 ?F (37 ?C)  ?SpO2: 95%  ? ? ?BP Readings from Last 3 Encounters:  ?03/07/22 130/80  ?11/19/20 (!) 155/96  ?04/02/20 130/80  ? ?Wt Readings from Last 3 Encounters:  ?03/07/22 242 lb (109.8 kg)  ?04/02/20 231 lb 12.8 oz (105.1 kg)  ?01/02/20 238 lb 12.8 oz (108.3 kg)  ? ? ?Physical Exam ?Constitutional:   ?   General: He is not in acute distress. ?   Appearance: He is not diaphoretic.  ?Cardiovascular:  ?   Rate and Rhythm: Normal rate and regular rhythm.  ?   Heart sounds: Normal heart sounds.  ?Pulmonary:  ?   Effort: Pulmonary effort is normal.  ?   Breath sounds: Normal breath sounds.  ?Musculoskeletal:  ?   Comments: No midline spine tenderness, no midline spine step-off, mild tenderness to palpation in the lumbar paraspinous muscles, no CVA tenderness on the right  ?Skin: ?   General: Skin is warm and dry.  ?Neurological:  ?   Mental Status: He is alert.  ?   Comments: 5/5 strength bilateral quads, hamstrings, plantarflexion, and dorsiflexion, sensation to light touch intact bilateral lower extremities  ? ? ? ?Assessment/Plan: Please see individual problem list. ? ?Problem List Items Addressed This Visit   ? ?  Essential hypertension (Chronic)  ?  Adequate control today.  Discussed trying to have somebody check it manually.  Lab work as outlined.  He will continue losartan 50 mg daily. ? ?  ?  ? Relevant Orders  ? Comp Met (CMET)  ? Lipid panel  ? HgB A1c  ? HLD (hyperlipidemia) (Chronic)  ?  Check lipid panel.  ? ?  ?  ? Right flank pain  ?  Likely related to a kidney stone.  We will get a CT scan to evaluate further.  We will try to get this in the next week.  We will send his urine for culture.  I will place him on Flomax to help pass the stone.  He will seek medical attention for worsening symptoms.  I did discuss the risk of lightheadedness with Flomax. ? ?  ?  ? Relevant Medications  ? tamsulosin (FLOMAX) 0.4 MG CAPS capsule  ? Other Relevant Orders  ? CT RENAL STONE STUDY  ? Urine Culture   ? Urine Microscopic  ? Sciatica  ?  Improving.  He will complete exercises for this.  If it is worsening he will let us know. ? ?  ?  ? ?Other Visit Diagnoses   ? ? Kidney pain    -  Primary  ? Relevant Orders  ? POCT Urinalysis Dipstick (Completed)  ? Right upper quadrant abdominal pain      ? ?  ? ? ? ?Return in about 6 months (around 09/06/2022) for Hypertension. ? ?This visit occurred during the SARS-CoV-2 public health emergency.  Safety protocols were in place, including screening questions prior to the visit, additional usage of staff PPE, and extensive cleaning of exam room while observing appropriate contact time as indicated for disinfecting solutions.  ? ? ?Tommi Rumps, MD ?[place] ? ?

## 2022-03-07 NOTE — Assessment & Plan Note (Signed)
Likely related to a kidney stone.  We will get a CT scan to evaluate further.  We will try to get this in the next week.  We will send his urine for culture.  I will place him on Flomax to help pass the stone.  He will seek medical attention for worsening symptoms.  I did discuss the risk of lightheadedness with Flomax. ?

## 2022-03-07 NOTE — Assessment & Plan Note (Signed)
Check lipid panel  

## 2022-03-07 NOTE — Assessment & Plan Note (Signed)
Adequate control today.  Discussed trying to have somebody check it manually.  Lab work as outlined.  He will continue losartan 50 mg daily. ?

## 2022-03-07 NOTE — Assessment & Plan Note (Signed)
Improving.  He will complete exercises for this.  If it is worsening he will let us know. ?

## 2022-03-08 LAB — URINE CULTURE
MICRO NUMBER:: 13326576
Result:: NO GROWTH
SPECIMEN QUALITY:: ADEQUATE

## 2022-03-11 ENCOUNTER — Other Ambulatory Visit: Payer: Self-pay | Admitting: Family Medicine

## 2022-03-11 ENCOUNTER — Encounter: Payer: Self-pay | Admitting: Family Medicine

## 2022-03-11 DIAGNOSIS — I10 Essential (primary) hypertension: Secondary | ICD-10-CM

## 2022-03-12 ENCOUNTER — Ambulatory Visit: Payer: BC Managed Care – PPO

## 2022-03-18 ENCOUNTER — Other Ambulatory Visit: Payer: BC Managed Care – PPO

## 2022-04-01 ENCOUNTER — Other Ambulatory Visit (HOSPITAL_COMMUNITY): Payer: Self-pay

## 2022-04-01 ENCOUNTER — Telehealth: Payer: Self-pay

## 2022-04-01 NOTE — Telephone Encounter (Signed)
[facility] Patient Teacher, English as a foreign language completed.    The patient is insured through [employer].  [employer] $30.00 will need a copay card  [employer] $0.00  We will continue to follow to see if copay assistance is needed.  Ileene Patrick, [facility] Patient [facility] for [facility]ious Disease Phone: [phone] Fax:  [phone]

## 2022-04-02 ENCOUNTER — Other Ambulatory Visit (HOSPITAL_COMMUNITY): Payer: Self-pay

## 2022-04-02 ENCOUNTER — Ambulatory Visit: Payer: BC Managed Care – PPO | Admitting: Pharmacist

## 2022-04-02 ENCOUNTER — Telehealth: Payer: Self-pay

## 2022-04-02 NOTE — Telephone Encounter (Signed)
[facility] Patient Advocate Encounter   Received notification from [facility] that prior authorization for Apretude is required.   PA submitted on 04/02/22 Key [id number] Status is pending    [facility] will continue to follow.   Ileene Patrick, [facility] Patient [facility] for [facility]ious Disease Phone: [phone] Fax:  [phone]

## 2022-04-03 ENCOUNTER — Other Ambulatory Visit: Payer: Self-pay | Admitting: Family Medicine

## 2022-04-03 ENCOUNTER — Telehealth: Payer: Self-pay

## 2022-04-03 ENCOUNTER — Other Ambulatory Visit (HOSPITAL_COMMUNITY): Payer: Self-pay

## 2022-04-03 DIAGNOSIS — R109 Unspecified abdominal pain: Secondary | ICD-10-CM

## 2022-04-03 NOTE — Telephone Encounter (Signed)
RCID Patient Advocate Encounter  Apretude is covered under the patient medical benefits.  J-Code T9122 & CPT-Code [id number] do not require a pre-cert. Ref # [id number]/25/23 11:57am  Patient will have a $40.00 office copay.   Patient is enrolled in [place] [place] Patient [facility] for Infectious Disease Phone: [phone] Fax:  [phone]

## 2022-04-09 ENCOUNTER — Telehealth: Payer: Self-pay

## 2022-04-09 NOTE — Telephone Encounter (Signed)
Called the patient to try and schedule next appointment for apretude injection following [employer] research coming to an end soon. Last injection was 4/14 per [facility] records. Patient did not answer and left a message.  Varney Daily, PharmD PGY1 Pharmacy Resident  Please check AMION for all [facility] pharmacy phone numbers After 10:00 PM call main pharmacy [phone]

## 2022-04-10 ENCOUNTER — Encounter: Payer: Self-pay | Admitting: Pharmacist

## 2022-04-14 ENCOUNTER — Other Ambulatory Visit: Payer: Self-pay | Admitting: Pharmacist

## 2022-04-14 ENCOUNTER — Other Ambulatory Visit (HOSPITAL_COMMUNITY): Payer: Self-pay

## 2022-04-14 DIAGNOSIS — Z79899 Other long term (current) drug therapy: Secondary | ICD-10-CM

## 2022-04-14 MED ORDER — APRETUDE 600 MG/3ML IM SUER: 600.0000 mg | INTRAMUSCULAR | 5 refills | Status: DC

## 2022-04-21 ENCOUNTER — Ambulatory Visit: Payer: BC Managed Care – PPO | Admitting: Pharmacist

## 2022-04-21 ENCOUNTER — Other Ambulatory Visit: Payer: BC Managed Care – PPO

## 2022-09-09 ENCOUNTER — Ambulatory Visit: Payer: BC Managed Care – PPO | Admitting: Family Medicine

## 2022-09-17 ENCOUNTER — Other Ambulatory Visit: Payer: Self-pay | Admitting: Family Medicine

## 2022-09-17 DIAGNOSIS — I10 Essential (primary) hypertension: Secondary | ICD-10-CM

## 2022-12-26 ENCOUNTER — Ambulatory Visit
Admission: EM | Admit: 2022-12-26 | Discharge: 2022-12-26 | Disposition: A | Discharge status: Home / Self Care | Payer: BC Managed Care – PPO | Attending: Urgent Care | Admitting: Urgent Care

## 2022-12-26 DIAGNOSIS — R0902 Hypoxemia: Secondary | ICD-10-CM

## 2022-12-26 DIAGNOSIS — R42 Dizziness and giddiness: Secondary | ICD-10-CM

## 2022-12-26 MED ORDER — MECLIZINE HCL 12.5 MG PO TABS: 12.5000 mg | ORAL_TABLET | Freq: Three times a day (TID) | ORAL | 0 refills | Status: DC | PRN

## 2022-12-26 NOTE — ED Triage Notes (Signed)
Patient presents to [facility] for vertigo. He states approximately 3 weeks ago he experienced an episode of dizziness and again today. Has a hx of vertigo was not prescribed meds.   Denies chest pain, SOB.

## 2022-12-26 NOTE — ED Provider Notes (Addendum)
[facility]    CSN: [id number] Arrival date & time: 12/26/22  1221      History   Chief Complaint Chief Complaint  Patient presents with   Dizziness    HPI Joseph Booth is a 62 y.o. male.    Dizziness   Patient presents to urgent care with concern for "vertigo".  He endorses an episode of dizziness which occurred approximately 3 weeks ago and then again today.  He states he has a history of vertigo and has no medications prescribed.  He denies shortness of breath.  Denies chest pain.  He endorses about 3 to 4 weeks of sinusitis but denies sinus pressure.  Denies ear popping.  Denies ear pain.  Past Medical History:  Diagnosis Date   Basal cell carcinoma 02/20/2020   right lat forehead   Bladder stones    Chronic gastritis 02/2018   GERD (gastroesophageal reflux disease)    History of gastric ulcer    History of kidney stones    History of stress test 02-09-2018   dr [name]   normal stress echo, normal RVSF, mild TR   Hypertension    cardiologist-  dr [name] ([facility])   Mixed hyperlipidemia    Nephrolithiasis    per renal ultrasound 02-19-2018 right renal stone nonobstructive   Seasonal allergies     Patient Active Problem List   Diagnosis Date Noted   Right flank pain 03/07/2022   Sciatica 03/07/2022   HLD (hyperlipidemia) 04/02/2020   Obesity (BMI 30.0-34.9) 04/02/2020   Patient in clinical research study 04/02/2020   Seborrheic keratosis 01/03/2020   Skin lesion of face 01/03/2020   Vertigo 01/03/2020   Rib pain on right side 01/03/2020   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   Type 1 superior labrum extending from anterior to posterior (SLAP) lesion of left shoulder 04/22/2019   Rotator cuff tendinitis, 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   Tendinitis of upper biceps tendon of left shoulder 01/12/2019   Ulnar neuropathy at elbow, 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   Neck pain on left side 10/12/2018   Calculus of bladder 02/23/2018   Nephrolithiasis  02/23/2018   Chest pain on exertion 02/04/2018   Dyspnea on exertion 02/04/2018   GERD (gastroesophageal reflux disease) 11/04/2017   Anxiety 11/04/2017   Essential hypertension 05/04/2016    Past Surgical History:  Procedure Laterality Date   CLOSED MANIPULATION SHOULDER WITH STERIOD INJECTION Left 09/20/2019   Procedure: CLOSED MANIPULATION SHOULDER WITH STEROID INJECTION;  Surgeon: Corky Mull, MD;  Location: [facility] OR[facility];  Service: Orthopedics;  Laterality: Left;   COLONOSCOPY  last one 2016   CYSTOSCOPY WITH LITHOLAPAXY Right 07/14/2018   Procedure: CYSTOSCOPY WITH RIGHT RETROGRADE PYELOGRAM, RIGHT URETEROSCOPY WITH LASER LITHOTRIPSY AND STONE [place] AND STENT PLACEMENT;  Surgeon: [name], MD;  [facility];  Service: Urology;  Laterality: Right;   EYE SURGERY     lasik   SHOULDER ARTHROSCOPY WITH OPEN ROTATOR CUFF REPAIR Left 04/21/2019   Procedure: SHOULDER ARTHROSCOPY WITH OPEN ROTATOR CUFF REPAIR;  Surgeon: Corky Mull, MD;  Location: [facility] OR[facility];  Service: Orthopedics;  Laterality: Left;   TONSILLECTOMY  1969   TYMPANOPLASTY Right 2000   UPPER GASTROINTESTINAL ENDOSCOPY  last one 02-24-2018   WRIST GANGLION EXCISION Left ?       Home Medications    Prior to Admission medications   Medication Sig Start Date End Date Taking? Authorizing Provider  Ascorbic Acid 500 MG CHEW Chew 2 each by mouth daily.  [provider]  cabotegravir ER (APRETUDE) 600 MG/3ML injection Inject 3 mLs (600 mg total) into the muscle every 2 (two) months. 04/14/22   Kuppelweiser, Cassie L, RPH-CPP  cetirizine (ZYRTEC) 10 MG tablet Take 10 mg by mouth daily as needed for allergies.     [provider]  esomeprazole (NEXIUM) 20 MG capsule Take 20-40 mg by mouth daily as needed (acid reflux).    [provider]  losartan (COZAAR) 50 MG tablet TAKE 1 TABLET BY MOUTH EVERY DAY 09/17/22   Leone Haven, MD  tamsulosin (FLOMAX) 0.4 MG  CAPS capsule TAKE 1 CAPSULE BY MOUTH EVERY DAY 04/03/22   Leone Haven, MD    Family History Family History  Problem Relation Age of Onset   Alcohol abuse Mother    Mental illness Sister    Squamous cell carcinoma Father    Colon cancer Neg Hx    Colon polyps Neg Hx    Esophageal cancer Neg Hx    Stomach cancer Neg Hx    Rectal cancer Neg Hx     Social History Social History   Tobacco Use   Smoking status: Never   Smokeless tobacco: Never  Vaping Use   Vaping Use: Never used  Substance Use Topics   Alcohol use: Yes    Alcohol/week: 0.0 - 1.0 standard drinks of alcohol    Comment: occasional   Drug use: No     Allergies   Pollen extract   Review of Systems Review of Systems  Neurological:  Positive for dizziness.     Physical Exam Triage Vital Signs ED Triage Vitals [12/26/22 1242]  Enc Vitals Group     BP      Pulse      Resp      Temp      Temp src      SpO2      Weight      Height      Head Circumference      Peak Flow      Pain Score 0     Pain Loc      Pain Edu?      Excl. in [place]?    No data found.  Updated Vital Signs There were no vitals taken for this visit.  Visual Acuity Right Eye Distance:   Left Eye Distance:   Bilateral Distance:    Right Eye Near:   Left Eye Near:    Bilateral Near:     Physical Exam Vitals reviewed.  Constitutional:      Appearance: Normal appearance. He is not ill-appearing or toxic-appearing.  Cardiovascular:     Rate and Rhythm: Normal rate and regular rhythm.     Pulses: Normal pulses.     Heart sounds: Normal heart sounds.  Pulmonary:     Effort: Pulmonary effort is normal.     Breath sounds: Normal breath sounds.  Skin:    General: Skin is warm and dry.  Neurological:     General: No focal deficit present.     Mental Status: He is alert and oriented to person, place, and time.  Psychiatric:        Mood and Affect: Mood normal.        Behavior: Behavior normal.      UC Treatments  / Results  Labs (all labs ordered are listed, but only abnormal results are displayed) Labs Reviewed - No data to display  EKG   Radiology No results found.  Procedures Procedures (including critical care time)  Medications Ordered in UC Medications - No data to display  Initial Impression / Assessment and Plan / UC Course  I have reviewed the triage vital signs and the nursing notes.  Pertinent labs & imaging results that were available during my care of the patient were reviewed by me and considered in my medical decision making (see chart for details).   Patient is afebrile here without recent antipyretics.  Patient is found to be satting 89% on room air.  Asymptomatic and denying acute or chronic respiratory illness.  94% on 2L O2.  Chest x-ray and further evaluation declined by patient  Overall is well appearing, well hydrated, without respiratory distress.  He has dizziness when he moves his head.    Vertigo versus eustachian tube dysfunction secondary to sinusitis.  Will prescribe meclizine for his symptoms.  Discussed additional treatment including antibiotic therapy for prolonged sinusitis which the patient declined.   Final Clinical Impressions(s) / UC Diagnoses   Final diagnoses:  None   Discharge Instructions   None    ED Prescriptions   None    PDMP not reviewed this encounter.   Rose Phi, FNP 12/26/22 1258    Rose Phi, FNP 12/26/22 1300

## 2022-12-26 NOTE — Discharge Instructions (Addendum)
Follow up here or with your primary care provider if your symptoms are worsening or not improving.     

## 2023-04-22 ENCOUNTER — Other Ambulatory Visit: Payer: Self-pay | Admitting: Family Medicine

## 2023-04-22 DIAGNOSIS — I10 Essential (primary) hypertension: Secondary | ICD-10-CM

## 2023-04-23 ENCOUNTER — Encounter: Payer: Self-pay | Admitting: *Deleted

## 2023-04-23 NOTE — Telephone Encounter (Signed)
My chart message sent to make appointment

## 2023-04-29 NOTE — Telephone Encounter (Signed)
I sent in a refill for the patient. He should keep his scheduled appointment to get further refills.

## 2023-05-04 ENCOUNTER — Encounter: Payer: Self-pay | Admitting: Family Medicine

## 2023-05-04 ENCOUNTER — Ambulatory Visit: Payer: BC Managed Care – PPO | Admitting: Family Medicine

## 2023-05-04 VITALS — BP 120/72 | HR 80 | Temp 98.3°F | Ht 71.0 in | Wt 244.0 lb

## 2023-05-04 DIAGNOSIS — J309 Allergic rhinitis, unspecified: Secondary | ICD-10-CM | POA: Insufficient documentation

## 2023-05-04 DIAGNOSIS — I10 Essential (primary) hypertension: Secondary | ICD-10-CM | POA: Diagnosis not present

## 2023-05-04 DIAGNOSIS — E669 Obesity, unspecified: Secondary | ICD-10-CM

## 2023-05-04 DIAGNOSIS — E785 Hyperlipidemia, unspecified: Secondary | ICD-10-CM | POA: Diagnosis not present

## 2023-05-04 DIAGNOSIS — Z125 Encounter for screening for malignant neoplasm of prostate: Secondary | ICD-10-CM

## 2023-05-04 DIAGNOSIS — R42 Dizziness and giddiness: Secondary | ICD-10-CM

## 2023-05-04 DIAGNOSIS — R21 Rash and other nonspecific skin eruption: Secondary | ICD-10-CM | POA: Insufficient documentation

## 2023-05-04 DIAGNOSIS — Z6830 Body mass index (BMI) 30.0-30.9, adult: Secondary | ICD-10-CM

## 2023-05-04 LAB — COMPREHENSIVE METABOLIC PANEL
ALT: 25 U/L (ref 0–53)
AST: 20 U/L (ref 0–37)
Albumin: 4.5 g/dL (ref 3.5–5.2)
Alkaline Phosphatase: 58 U/L (ref 39–117)
BUN: 13 mg/dL (ref 6–23)
CO2: 29 mEq/L (ref 19–32)
Calcium: 10.1 mg/dL (ref 8.4–10.5)
Chloride: 103 mEq/L (ref 96–112)
Creatinine, Ser: 1.23 mg/dL (ref 0.40–1.50)
GFR: 64.24 mL/min (ref >60.00)
Glucose, Bld: 169 mg/dL — ABNORMAL HIGH (ref 70–99)
Potassium: 4.5 mEq/L (ref 3.5–5.1)
Sodium: 138 mEq/L (ref 135–145)
Total Bilirubin: 1.1 mg/dL (ref 0.2–1.2)
Total Protein: 6.9 g/dL (ref 6.0–8.3)

## 2023-05-04 LAB — LIPID PANEL
Cholesterol: 149 mg/dL (ref 0–200)
HDL: 26.6 mg/dL — ABNORMAL LOW (ref >39.00)
LDL Cholesterol: 90 mg/dL (ref 0–99)
NonHDL: 122.54
Total CHOL/HDL Ratio: 6
Triglycerides: 162 mg/dL — ABNORMAL HIGH (ref 0.0–149.0)
VLDL: 32.4 mg/dL (ref 0.0–40.0)

## 2023-05-04 LAB — HEMOGLOBIN A1C: Hgb A1c MFr Bld: 5.8 % (ref 4.6–6.5)

## 2023-05-04 LAB — PSA: PSA: 0.92 ng/mL (ref 0.10–4.00)

## 2023-05-04 MED ORDER — CETIRIZINE HCL 10 MG PO TABS: 10.0000 mg | ORAL_TABLET | Freq: Every day | ORAL | 3 refills | Status: DC | PRN

## 2023-05-04 MED ORDER — TRIAMCINOLONE ACETONIDE 0.1 % EX CREA: 1.0000 | TOPICAL_CREAM | Freq: Two times a day (BID) | CUTANEOUS | 0 refills | Status: DC

## 2023-05-04 NOTE — Progress Notes (Signed)
Marikay Alar, MD Phone: [phone]  Joseph Booth is a 62 y.o. male who presents today for f/u.  HYPERTENSION Disease Monitoring Home BP Monitoring not checking at home Chest pain- no    Dyspnea- no Medications Compliance-  taking losartan.   Edema- no BMET    Component Value Date/Time   NA 139 03/07/2022 1155   K 4.4 03/07/2022 1155   CL 102 03/07/2022 1155   CO2 30 03/07/2022 1155   GLUCOSE 105 (H) 03/07/2022 1155   BUN 17 03/07/2022 1155   CREATININE 1.13 03/07/2022 1155   CALCIUM 9.4 03/07/2022 1155   GFRNONAA >60 02/03/2018 1828   GFRAA >60 02/03/2018 1828   Hyperlipidemia: Patient notes they are trying to eat a little healthier.  Try to bake their foods.  Does have a good amount of sweet tea per day.  Has less than 1 soda per day.  No exercise.  Vertigo: Patient had dizziness and went to urgent care.  They did treat him for a sinus infection and gave him meclizine.  He has not had any recurrent dizziness.  Allergic rhinitis: Patient notes intermittent issues with sneezing, mild cough, and blowing his nose.  No postnasal drip or rhinorrhea.  He ran out of Zyrtec.  He occasionally uses Flonase.  Rash: Patient notes on 4 occasions over the last year he will get something like a bug bite on his lower leg and then he will end up with more of a rash around it that itches significantly.  It lasts 2 to 3 weeks and goes on away on its own.  Notes over-the-counter itch medication is not helpful.  No soap changes, detergent changes, medicine changes, supplement changes, or contacts with similar symptoms.  He has not identified any specific cause for this as it can happen if he has been outside or inside.  The rash is not currently present.  Social History   Tobacco Use  Smoking Status Never  Smokeless Tobacco Never    Current Outpatient Medications on File Prior to Visit  Medication Sig Dispense Refill   Ascorbic Acid 500 MG CHEW Chew 2 each by mouth daily.     esomeprazole  (NEXIUM) 20 MG capsule Take 20-40 mg by mouth daily as needed (acid reflux).     losartan (COZAAR) 50 MG tablet TAKE 1 TABLET BY MOUTH EVERY DAY 90 tablet 0   meclizine (ANTIVERT) 12.5 MG tablet Take 1 tablet (12.5 mg total) by mouth 3 (three) times daily as needed for dizziness. 30 tablet 0   No current facility-administered medications on file prior to visit.     ROS see history of present illness  Objective  Physical Exam Vitals:   05/04/23 0840  BP: 120/72  Pulse: 80  Temp: 98.3 F (36.8 C)  SpO2: 93%    BP Readings from Last 3 Encounters:  05/04/23 120/72  12/26/22 (!) 159/98  03/07/22 130/80   Wt Readings from Last 3 Encounters:  05/04/23 244 lb (110.7 kg)  03/07/22 242 lb (109.8 kg)  04/02/20 231 lb 12.8 oz (105.1 kg)    Physical Exam Constitutional:      General: He is not in acute distress.    Appearance: He is not diaphoretic.  Cardiovascular:     Rate and Rhythm: Normal rate and regular rhythm.     Heart sounds: Normal heart sounds.  Pulmonary:     Effort: Pulmonary effort is normal.     Breath sounds: Normal breath sounds.  Musculoskeletal:     Right  lower leg: No edema.     Left lower leg: No edema.  Skin:    General: Skin is warm and dry.  Neurological:     Mental Status: He is alert.      Assessment/Plan: Please see individual problem list.  Essential hypertension Assessment & Plan: Chronic issue.  Adequately controlled.  He will continue losartan 50 mg daily.  Orders: -     Comprehensive metabolic panel  Hyperlipidemia, unspecified hyperlipidemia type Assessment & Plan: Chronic issue.  Encouraged healthy diet and exercise.  Check labs.  Orders: -     Comprehensive metabolic panel -     Lipid panel  Allergic rhinitis, unspecified seasonality, unspecified trigger Assessment & Plan: Chronic issue.  Suboptimally controlled.  He will restart Zyrtec 10 mg daily.  Discussed if it is not beneficial he could add Flonase  over-the-counter.  Orders: -     Cetirizine HCl; Take 1 tablet (10 mg total) by mouth daily as needed for allergies.  Dispense: 90 tablet; Refill: 3  Rash Assessment & Plan: Undetermined cause.  Possibly related to bug bites or contact dermatitis.  He does not have the rash at this time.  I will give him topical triamcinolone cream 0.1% to use if he has a recurrence.  I advised him to take a picture if he has a recurrence.  Orders: -     Triamcinolone Acetonide; Apply 1 Application topically 2 (two) times daily.  Dispense: 30 g; Refill: 0  Vertigo Assessment & Plan: Resolved.  Possibly related to sinus or allergy issues.  He will monitor for recurrence.   Obesity (BMI 30.0-34.9) -     Hemoglobin A1c  Prostate cancer screening -     PSA     Health Maintenance: Patient notes he is going to call to get his colonoscopy set up for later this year.  I encouraged him to get the Shingrix vaccine as well.  He notes he would get this likely at the pharmacy.  Return in about 6 months (around 11/03/2023) for Hypertension.   Marikay Alar, MD [facility]

## 2023-05-04 NOTE — Assessment & Plan Note (Signed)
Chronic issue.  Suboptimally controlled.  He will restart Zyrtec 10 mg daily.  Discussed if it is not beneficial he could add Flonase over-the-counter.

## 2023-05-04 NOTE — Assessment & Plan Note (Signed)
Chronic issue.  Encouraged healthy diet and exercise.  Check labs.

## 2023-05-04 NOTE — Assessment & Plan Note (Signed)
Resolved.  Possibly related to sinus or allergy issues.  He will monitor for recurrence.

## 2023-05-04 NOTE — Assessment & Plan Note (Signed)
Undetermined cause.  Possibly related to bug bites or contact dermatitis.  He does not have the rash at this time.  I will give him topical triamcinolone cream 0.1% to use if he has a recurrence.  I advised him to take a picture if he has a recurrence.

## 2023-05-04 NOTE — Assessment & Plan Note (Signed)
Chronic issue.  Adequately controlled.  He will continue losartan 50 mg daily.  

## 2023-06-08 ENCOUNTER — Emergency Department
Admission: EM | Admit: 2023-06-08 | Discharge: 2023-06-08 | Disposition: A | Discharge status: Home / Self Care | Payer: BC Managed Care – PPO | Attending: Emergency Medicine | Admitting: Emergency Medicine

## 2023-06-08 ENCOUNTER — Ambulatory Visit
Admission: RE | Admit: 2023-06-08 | Discharge: 2023-06-08 | Disposition: A | Discharge status: Home / Self Care | Payer: BC Managed Care – PPO | Source: Ambulatory Visit | Attending: Urgent Care | Admitting: Urgent Care

## 2023-06-08 ENCOUNTER — Other Ambulatory Visit: Payer: Self-pay

## 2023-06-08 ENCOUNTER — Emergency Department: Payer: BC Managed Care – PPO

## 2023-06-08 ENCOUNTER — Encounter: Payer: Self-pay | Admitting: Intensive Care

## 2023-06-08 VITALS — BP 155/96 | HR 78 | Temp 98.7°F | Resp 18

## 2023-06-08 DIAGNOSIS — N5082 Scrotal pain: Secondary | ICD-10-CM | POA: Diagnosis not present

## 2023-06-08 DIAGNOSIS — I10 Essential (primary) hypertension: Secondary | ICD-10-CM | POA: Diagnosis not present

## 2023-06-08 DIAGNOSIS — N503 Cyst of epididymis: Secondary | ICD-10-CM | POA: Diagnosis not present

## 2023-06-08 DIAGNOSIS — N50812 Left testicular pain: Secondary | ICD-10-CM | POA: Diagnosis present

## 2023-06-08 LAB — POCT URINALYSIS DIP (MANUAL ENTRY)
Bilirubin, UA: NEGATIVE
Blood, UA: NEGATIVE
Glucose, UA: NEGATIVE mg/dL
Ketones, POC UA: NEGATIVE mg/dL
Leukocytes, UA: NEGATIVE
Nitrite, UA: NEGATIVE
Protein Ur, POC: 30 mg/dL — AB
Spec Grav, UA: >=1.03 — AB (ref 1.010–1.025)
Urobilinogen, UA: 1 E.U./dL — AB
pH, UA: 5.5 (ref 5.0–8.0)

## 2023-06-08 LAB — CBC WITH DIFFERENTIAL/PLATELET
Abs Immature Granulocytes: 0.03 10*3/uL (ref 0.00–0.07)
Basophils Absolute: 0 10*3/uL (ref 0.0–0.1)
Basophils Relative: 1 %
Eosinophils Absolute: 0.3 10*3/uL (ref 0.0–0.5)
Eosinophils Relative: 5 %
HCT: 45.1 % (ref 39.0–52.0)
Hemoglobin: 14.8 g/dL (ref 13.0–17.0)
Immature Granulocytes: 1 %
Lymphocytes Relative: 23 %
Lymphs Abs: 1.4 10*3/uL (ref 0.7–4.0)
MCH: 29.5 pg (ref 26.0–34.0)
MCHC: 32.8 g/dL (ref 30.0–36.0)
MCV: 90 fL (ref 80.0–100.0)
Monocytes Absolute: 0.4 10*3/uL (ref 0.1–1.0)
Monocytes Relative: 6 %
Neutro Abs: 4 10*3/uL (ref 1.7–7.7)
Neutrophils Relative %: 64 %
Platelets: 183 10*3/uL (ref 150–400)
RBC: 5.01 MIL/uL (ref 4.22–5.81)
RDW: 13.4 % (ref 11.5–15.5)
WBC: 6.2 10*3/uL (ref 4.0–10.5)
nRBC: 0 % (ref 0.0–0.2)

## 2023-06-08 LAB — URINALYSIS, ROUTINE W REFLEX MICROSCOPIC
Bilirubin Urine: NEGATIVE
Glucose, UA: NEGATIVE mg/dL
Hgb urine dipstick: NEGATIVE
Ketones, ur: NEGATIVE mg/dL
Leukocytes,Ua: NEGATIVE
Nitrite: NEGATIVE
Protein, ur: NEGATIVE mg/dL
Specific Gravity, Urine: 1.021 (ref 1.005–1.030)
pH: 5 (ref 5.0–8.0)

## 2023-06-08 LAB — COMPREHENSIVE METABOLIC PANEL
ALT: 36 U/L (ref 0–44)
AST: 26 U/L (ref 15–41)
Albumin: 4.6 g/dL (ref 3.5–5.0)
Alkaline Phosphatase: 59 U/L (ref 38–126)
Anion gap: 8 (ref 5–15)
BUN: 15 mg/dL (ref 6–20)
CO2: 25 mmol/L (ref 22–32)
Calcium: 8.9 mg/dL (ref 8.9–10.3)
Chloride: 104 mmol/L (ref 98–111)
Creatinine, Ser: 1.09 mg/dL (ref 0.61–1.24)
GFR, Estimated: >60 mL/min (ref >60)
Glucose, Bld: 224 mg/dL — ABNORMAL HIGH (ref 70–99)
Potassium: 4.3 mmol/L (ref 3.5–5.1)
Sodium: 137 mmol/L (ref 135–145)
Total Bilirubin: 1.3 mg/dL — ABNORMAL HIGH (ref 0.3–1.2)
Total Protein: 7.1 g/dL (ref 6.5–8.1)

## 2023-06-08 MED ORDER — LEVOFLOXACIN 500 MG PO TABS: 500.0000 mg | ORAL_TABLET | Freq: Every day | ORAL | 0 refills | Status: AC

## 2023-06-08 NOTE — ED Provider Notes (Signed)
[facility] Provider Note    Event Date/Time   First MD Initiated Contact with Patient 06/08/23 1251     (approximate)   History   Testicle Pain   HPI  Joseph Booth is a 63 y.o. male who presents today for evaluation of left-sided testicular pain that began 2 days ago.  Patient reports that he has not noticed any changes to the area.  He has not had any burning with urination.  He reports that he has pain with sitting for prolonged periods of time or when changing position.  No fevers or chills.  He went to urgent care and they sent him to the emergency department for evaluation.  Patient reports that he has not been sexually active in several years.  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   Type 1 superior labrum extending from anterior to posterior (SLAP) lesion of left shoulder 04/22/2019   Rotator cuff tendinitis, 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   Tendinitis of upper biceps tendon of left shoulder 01/12/2019   Ulnar neuropathy at elbow, 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   Neck pain on left side 10/12/2018   Calculus of bladder 02/23/2018   Nephrolithiasis 02/23/2018   Chest pain on exertion 02/04/2018   Dyspnea on exertion 02/04/2018   GERD (gastroesophageal reflux disease) 11/04/2017   Anxiety 11/04/2017   Essential hypertension 05/04/2016          Physical Exam   Triage Vital Signs: ED Triage Vitals  Encounter Vitals Group     BP 06/08/23 1200 (!) 157/96     Systolic BP Percentile --      Diastolic BP Percentile --      Pulse Rate 06/08/23 1200 76     Resp  06/08/23 1200 18     Temp 06/08/23 1200 98.8 F (37.1 C)     Temp Source 06/08/23 1200 Oral     SpO2 06/08/23 1200 97 %     Weight 06/08/23 1203 235 lb (106.6 kg)     Height 06/08/23 1203 5\' 11"  (1.803 m)     Head Circumference --      Peak Flow --      Pain Score 06/08/23 1203 7     Pain Loc --      Pain Education --      Exclude from Growth Chart --     Most recent vital signs: Vitals:   06/08/23 1200  BP: (!) 157/96  Pulse: 76  Resp: 18  Temp: 98.8 F (37.1 C)  SpO2: 97%    Physical Exam Vitals and nursing note reviewed.  Constitutional:      General: Awake and alert. No acute distress.    Appearance: Normal appearance. The patient is normal weight.  HENT:     Head: Normocephalic and atraumatic.     Mouth: Mucous membranes are moist.  Eyes:     General: PERRL. Normal EOMs        Right eye: No discharge.        Left eye: No discharge.     Conjunctiva/sclera: Conjunctivae normal.  Cardiovascular:  Rate and Rhythm: Normal rate and regular rhythm.     Pulses: Normal pulses.  Pulmonary:     Effort: Pulmonary effort is normal. No respiratory distress.     Breath sounds: Normal breath sounds.  Abdominal:     Abdomen is soft. There is no abdominal tenderness. No rebound or guarding. No distention. GU: Performed with chaperone Les.  Normal-appearing penis and scrotum.  Tenderness palpation to left testicle without erythema or swelling. Musculoskeletal:        General: No swelling. Normal range of motion.     Cervical back: Normal range of motion and neck supple.  Skin:    General: Skin is warm and dry.     Capillary Refill: Capillary refill takes less than 2 seconds.     Findings: No rash.  Neurological:     Mental Status: The patient is awake and alert.      ED Results / Procedures / Treatments   Labs (all labs ordered are listed, but only abnormal results are displayed) Labs Reviewed  COMPREHENSIVE METABOLIC PANEL - Abnormal; Notable for the following  components:      Result Value   Glucose, Bld 224 (*)    Total Bilirubin 1.3 (*)    All other components within normal limits  URINALYSIS, ROUTINE W REFLEX MICROSCOPIC - Abnormal; Notable for the following components:   Color, Urine YELLOW (*)    APPearance HAZY (*)    All other components within normal limits  CBC WITH DIFFERENTIAL/PLATELET     EKG     RADIOLOGY I independently reviewed and interpreted imaging and agree with radiologists findings.     PROCEDURES:  Critical Care performed:   Procedures   MEDICATIONS ORDERED IN ED: Medications - No data to display   IMPRESSION / MDM / ASSESSMENT AND PLAN / ED COURSE  I reviewed the triage vital signs and the nursing notes.   Differential diagnosis includes, but is not limited to, epididymitis, urinary tract infection, orchitis, less likely torsion.  Patient is awake and alert, hemodynamically stable and afebrile.  He has a normal-appearing testicle and scrotum.  I reviewed the patient's chart.  He just came from urgent care where he was instructed to come to the emergency department for an ultrasound.  Patient has a normal testicular lie, there does not appear to be significant swelling or erythema.  However he has tenderness over his epididymis of his left testicle.  Urinalysis is overall reassuring.  His ultrasound reveals a left epididymal cyst.  Given his degree of pain, discussed the option of empiric treatment for possible epididymitis/infected cyst which patient would like to proceed with.  He was started on levofloxacin given no concern for STD given that he reports that he has not been sexually active in multiple years.  We discussed the side effects of levofloxacin, and he was advised not to perform any exertional activity.  I also recommend that he follow-up with urology.  We also discussed return precautions.  Patient understands and agrees with plan.  He was discharged in stable condition.   Patient's  presentation is most consistent with acute complicated illness / injury requiring diagnostic workup.    FINAL CLINICAL IMPRESSION(S) / ED DIAGNOSES   Final diagnoses:  Epididymal cyst     Rx / DC Orders   ED Discharge Orders          Ordered    levofloxacin (LEVAQUIN) 500 MG tablet  Daily        06/08/23 1500  Note:  This document was prepared using Dragon voice recognition software and may include unintentional dictation errors.   Jackelyn Hoehn, PA-C 06/08/23 1507    Phineas Semen, MD 06/08/23 [phone]

## 2023-06-08 NOTE — ED Triage Notes (Addendum)
Patient to Urgent Care with complaints of left sided testicular pain that started two days ago. Denies any known injury.   Describes pain as sharp and radiates into his abdomen. Denies any redness or swelling. Worse when changing position. Intermittently worsens.

## 2023-06-08 NOTE — Discharge Instructions (Signed)
You have a small epididymal cyst.  Given the degree of discomfort, we have decided to treat it as infected for possible epididymitis as we discussed.  Please do not perform any exertional activities as we discussed. Please follow up with urology. Return for new, worsening, or change in symptoms or other concerns.

## 2023-06-08 NOTE — Discharge Instructions (Addendum)
Given we are unable to rule out serious causes for your symptoms without ultrasound, I am recommending that you transfer care to the ED for additional evaluation.

## 2023-06-08 NOTE — ED Triage Notes (Signed)
Sent from [facility] for left sided testicle pain. Started Saturday. Denies injury.   Reports pain is sharp and radiates up to abdomen. Denies swelling.   Worse with movement

## 2023-06-08 NOTE — ED Provider Notes (Signed)
Joseph Booth    CSN: [id number] Arrival date & time: 06/08/23  1044      History   Chief Complaint Chief Complaint  Patient presents with   Groin Pain    Pain in left testicle. - Entered by patient    HPI Joseph Booth is a 62 y.o. male.    Groin Pain  Presents to urgent care with complaint of left-sided testicular pain starting 2 days ago.  Denies any known injury or precipitating event.  Describes pain as sharp and radiating into his abdomen.  No redness or swelling.  Patient states symptoms are worse when changing position.  Denies sexually active and no risk of STI.  Past Medical History:  Diagnosis Date   Basal cell carcinoma 02/20/2020   right lat forehead   Bladder stones    Chronic gastritis 02/2018   GERD (gastroesophageal reflux disease)    History of gastric ulcer    History of kidney stones    History of stress test 02-09-2018   dr Gwen Pounds   normal stress echo, normal RVSF, mild TR   Hypertension    cardiologist-  dr Gwen Pounds ([place])   Mixed hyperlipidemia    Nephrolithiasis    per renal ultrasound 02-19-2018 right renal stone nonobstructive   Seasonal allergies     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   Type 1 superior labrum extending from anterior to posterior (SLAP) lesion of left shoulder 04/22/2019   Rotator cuff tendinitis, 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   Tendinitis of upper biceps tendon of left shoulder 01/12/2019   Ulnar neuropathy at elbow, 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   Neck pain on left side 10/12/2018   Calculus of bladder  02/23/2018   Nephrolithiasis 02/23/2018   Chest pain on exertion 02/04/2018   Dyspnea on exertion 02/04/2018   GERD (gastroesophageal reflux disease) 11/04/2017   Anxiety 11/04/2017   Essential hypertension 05/04/2016    Past Surgical History:  Procedure Laterality Date   CLOSED MANIPULATION SHOULDER WITH STERIOD INJECTION Left 09/20/2019   Procedure: CLOSED MANIPULATION SHOULDER WITH STEROID INJECTION;  Surgeon: Christena Flake, MD;  Location: [facility];  Service: Orthopedics;  Laterality: Left;   COLONOSCOPY  last one 2016   CYSTOSCOPY WITH LITHOLAPAXY Right 07/14/2018   Procedure: CYSTOSCOPY WITH RIGHT RETROGRADE PYELOGRAM, RIGHT URETEROSCOPY WITH LASER LITHOTRIPSY AND STONE BASKETTING AND STENT PLACEMENT;  Surgeon: Rene Paci, MD;  Location: [facility];  Service: Urology;  Laterality: Right;   EYE SURGERY     lasik   SHOULDER ARTHROSCOPY WITH OPEN ROTATOR CUFF REPAIR Left 04/21/2019   Procedure: SHOULDER ARTHROSCOPY WITH OPEN ROTATOR CUFF REPAIR;  Surgeon: Christena Flake, MD;  Location: [facility];  Service: Orthopedics;  Laterality: Left;   TONSILLECTOMY  1969   TYMPANOPLASTY Right 2000   UPPER GASTROINTESTINAL ENDOSCOPY  last one 02-24-2018   WRIST GANGLION EXCISION Left ?       Home Medications    Prior to Admission medications   Medication Sig Start Date End Date Taking? Authorizing Provider  Ascorbic Acid 500 MG CHEW Chew 2 each by  mouth daily.    [provider]  cetirizine (ZYRTEC) 10 MG tablet Take 1 tablet (10 mg total) by mouth daily as needed for allergies. 05/04/23   Glori Luis, MD  esomeprazole (NEXIUM) 20 MG capsule Take 20-40 mg by mouth daily as needed (acid reflux).    [provider]  losartan (COZAAR) 50 MG tablet TAKE 1 TABLET BY MOUTH EVERY DAY 04/29/23   Glori Luis, MD  meclizine (ANTIVERT) 12.5 MG tablet Take 1 tablet (12.5 mg total) by mouth 3 (three) times daily as needed for dizziness. 12/26/22    Greidy Sherard, Jeannett Senior, FNP  triamcinolone cream (KENALOG) 0.1 % Apply 1 Application topically 2 (two) times daily. 05/04/23   Glori Luis, MD    Family History Family History  Problem Relation Age of Onset   Alcohol abuse Mother    Mental illness Sister    Squamous cell carcinoma Father    Colon cancer Neg Hx    Colon polyps Neg Hx    Esophageal cancer Neg Hx    Stomach cancer Neg Hx    Rectal cancer Neg Hx     Social History Social History   Tobacco Use   Smoking status: Never   Smokeless tobacco: Never  Vaping Use   Vaping status: Never Used  Substance Use Topics   Alcohol use: Yes    Alcohol/week: 0.0 - 1.0 standard drinks of alcohol    Comment: occasional   Drug use: No     Allergies   Pollen extract   Review of Systems Review of Systems   Physical Exam Triage Vital Signs ED Triage Vitals  Encounter Vitals Group     BP 06/08/23 1103 (!) 155/96     Systolic BP Percentile --      Diastolic BP Percentile --      Pulse Rate 06/08/23 1103 78     Resp 06/08/23 1103 18     Temp 06/08/23 1103 98.7 F (37.1 C)     Temp src --      SpO2 06/08/23 1103 98 %     Weight --      Height --      Head Circumference --      Peak Flow --      Pain Score 06/08/23 1058 7     Pain Loc --      Pain Education --      Exclude from Growth Chart --    No data found.  Updated Vital Signs BP (!) 155/96   Pulse 78   Temp 98.7 F (37.1 C)   Resp 18   SpO2 98%   Visual Acuity Right Eye Distance:   Left Eye Distance:   Bilateral Distance:    Right Eye Near:   Left Eye Near:    Bilateral Near:     Physical Exam Genitourinary:    Testes:        Left: Tenderness present. Cremasteric reflex is absent.   Skin:    General: Skin is warm and dry.  Neurological:     General: No focal deficit present.     Mental Status: He is oriented to person, place, and time.  Psychiatric:        Mood and Affect: Mood normal.        Behavior: Behavior normal.      UC  Treatments / Results  Labs (all labs ordered are listed, but only abnormal results are displayed) Labs Reviewed - No data to display  EKG   Radiology No results found.  Procedures Procedures (including critical care time)  Medications Ordered in UC Medications - No data to display  Initial Impression / Assessment and Plan / UC Course  I have reviewed the triage vital signs and the nursing notes.  Pertinent labs & imaging results that were available during my care of the patient were reviewed by me and considered in my medical decision making (see chart for details).   Damarrius Kulas is a 62 y.o. male presenting with acute scrotal pain. Patient is afebrile without recent antipyretics, satting well on room air. Overall is well appearing though in obvious discomfort. He appears well hydrated, without respiratory distress. No scrotal inflammation noted. Exquisite L sided scrotal tenderness with palpation at the inferior pole. Cremasteric reflex not noted.  UA is nonsuggestive of UTI.  Patient states he did not obtain first catch urine so unclear if this was a good test.  Cannot rule out epididymitis.  Considering testicular torsion though given his symptoms are now x 2 days I think this is lower on the differential but cannot rule out in urgent care.  Recommending patient go to ED for ultrasound.  Reviewed relevant chart history.   Counseled patient on potential for adverse effects with medications prescribed/recommended today, ER and return-to-clinic precautions discussed, patient verbalized understanding and agreement with care plan.  Final Clinical Impressions(s) / UC Diagnoses   Final diagnoses:  None   Discharge Instructions   None    ED Prescriptions   None    PDMP not reviewed this encounter.   Charma Igo, [place] 06/08/23 1128

## 2023-06-08 NOTE — ED Notes (Signed)
Patient is being discharged from the Urgent Care and sent to the Emergency Department via POV . Per Joseph Senior Immordino FNP, patient is in need of higher level of care due to scrotal pain. Patient is aware and verbalizes understanding of plan of care.  Vitals:   06/08/23 1103  BP: (!) 155/96  Pulse: 78  Resp: 18  Temp: 98.7 F (37.1 C)  SpO2: 98%

## 2023-06-12 ENCOUNTER — Telehealth: Payer: Self-pay

## 2023-06-12 NOTE — Transitions of Care (Post Inpatient/ED Visit) (Signed)
Unable to reach pt by phone and left v/m for pt to cb [phone]. Per appt notes pt already scheduled appt with [facility] urology on 07/01/23.      06/12/2023  Name: Joseph Booth MRN: [id number] DOB: 1961-08-01  Today's TOC FU Call Status: Today's TOC FU Call Status:: Unsuccessful Call (1st Attempt) Unsuccessful Call (1st Attempt) Date: 06/12/23  Attempted to reach the patient regarding the most recent Inpatient/ED visit.  Follow Up Plan: Additional outreach attempts will be made to reach the patient to complete the Transitions of Care (Post Inpatient/ED visit) call.   Signature Lewanda Rife, LPN

## 2023-07-01 ENCOUNTER — Ambulatory Visit: Payer: BC Managed Care – PPO | Admitting: Urology

## 2023-07-01 ENCOUNTER — Ambulatory Visit
Admission: RE | Admit: 2023-07-01 | Discharge: 2023-07-01 | Disposition: A | Discharge status: Home / Self Care | Payer: BC Managed Care – PPO | Source: Ambulatory Visit | Attending: Urology | Admitting: Urology

## 2023-07-01 VITALS — BP 131/79 | HR 81

## 2023-07-01 DIAGNOSIS — R81 Glycosuria: Secondary | ICD-10-CM | POA: Diagnosis not present

## 2023-07-01 DIAGNOSIS — Z87442 Personal history of urinary calculi: Secondary | ICD-10-CM | POA: Diagnosis present

## 2023-07-01 DIAGNOSIS — R350 Frequency of micturition: Secondary | ICD-10-CM | POA: Diagnosis not present

## 2023-07-01 DIAGNOSIS — R1032 Left lower quadrant pain: Secondary | ICD-10-CM

## 2023-07-01 DIAGNOSIS — N503 Cyst of epididymis: Secondary | ICD-10-CM | POA: Diagnosis not present

## 2023-07-01 LAB — URINALYSIS, COMPLETE
Bilirubin, UA: NEGATIVE
Ketones, UA: NEGATIVE
Leukocytes,UA: NEGATIVE
Nitrite, UA: NEGATIVE
Specific Gravity, UA: 1.02 (ref 1.005–1.030)
Urobilinogen, Ur: 0.2 mg/dL (ref 0.2–1.0)
pH, UA: 5.5 (ref 5.0–7.5)

## 2023-07-01 LAB — MICROSCOPIC EXAMINATION

## 2023-07-01 NOTE — Progress Notes (Signed)
I,Dina M Abdulla,acting as a scribe for Vanna Scotland, MD.,have documented all relevant documentation on the behalf of Vanna Scotland, MD,as directed by  Vanna Scotland, MD while in the presence of Vanna Scotland, MD.  07/01/2023 9:18 AM   Joseph Booth [birth year] [id number]  Referring provider: Glori Luis, MD [address] [place],  [place] [address]  Chief Complaint  Patient presents with   New Patient (Initial Visit)     ER follow up; left sided testicular pain, epididymal cyst     HPI: 62 year old male presenting today for further evaluation of testicular pain.  He was seen and evaluated in the emergency room on 06/08/2023 after 2 days of left testicular pain. He underwent scrotal ultrasound that showed no evidence of testicular torsion, good blood flow bilaterally. He had an incidental, very small epididymal cyst measuring 6 mm with small bilateral hydrocyles. Otherwise no geopathology appreciated. He also had a negative urinalysis along with negative labs.  He does have a personal history of kidney stones and underwent ureteroscopy in 2019 for this.  He continues to have left testicular pain.  Its primarily with movement.  He is also noting pain in his left lower quadrant over the past several days which is new.  No nausea or vomiting.  No fevers or chills.  No dysuria or gross hematuria.  He does not recall experiencing any trauma in relationship to his pain, but does claim to have urinary issues, including increased urinary frequency x 2 weeks.    He has 3+ glucose in his urine but denies being a diabetic.  UA otherwise negative.     PMH: Past Medical History:  Diagnosis Date   Basal cell carcinoma 02/20/2020   right lat forehead   Bladder stones    Chronic gastritis 02/2018   GERD (gastroesophageal reflux disease)    History of gastric ulcer    History of kidney stones    History of stress test 02-09-2018   dr Gwen Pounds   normal stress echo, normal  RVSF, mild TR   Hypertension    cardiologist-  dr Gwen Pounds [place])   Mixed hyperlipidemia    Nephrolithiasis    per renal ultrasound 02-19-2018 right renal stone nonobstructive   Seasonal allergies     Surgical History: Past Surgical History:  Procedure Laterality Date   CLOSED MANIPULATION SHOULDER WITH STERIOD INJECTION Left 09/20/2019   Procedure: CLOSED MANIPULATION SHOULDER WITH STEROID INJECTION;  Surgeon: Christena Flake, MD;  Location: [facility];  Service: Orthopedics;  Laterality: Left;   COLONOSCOPY  last one 2016   CYSTOSCOPY WITH LITHOLAPAXY Right 07/14/2018   Procedure: CYSTOSCOPY WITH RIGHT RETROGRADE PYELOGRAM, RIGHT URETEROSCOPY WITH LASER LITHOTRIPSY AND STONE BASKETTING AND STENT PLACEMENT;  Surgeon: Rene Paci, MD;  Location: [facility];  Service: Urology;  Laterality: Right;   EYE SURGERY     lasik   SHOULDER ARTHROSCOPY WITH OPEN ROTATOR CUFF REPAIR Left 04/21/2019   Procedure: SHOULDER ARTHROSCOPY WITH OPEN ROTATOR CUFF REPAIR;  Surgeon: Christena Flake, MD;  Location: [facility];  Service: Orthopedics;  Laterality: Left;   TONSILLECTOMY  1969   TYMPANOPLASTY Right 2000   UPPER GASTROINTESTINAL ENDOSCOPY  last one 02-24-2018   WRIST GANGLION EXCISION Left ?    Home Medications:  Allergies as of 07/01/2023       Reactions   Pollen Extract         Medication List        Accurate as of  July 01, 2023  9:18 AM. If you have any questions, ask your nurse or doctor.          Ascorbic Acid 500 MG Chew Chew 2 each by mouth daily.   cetirizine 10 MG tablet Commonly known as: ZYRTEC Take 1 tablet (10 mg total) by mouth daily as needed for allergies.   esomeprazole 20 MG capsule Commonly known as: NEXIUM Take 20-40 mg by mouth daily as needed (acid reflux).   losartan 50 MG tablet Commonly known as: COZAAR TAKE 1 TABLET BY MOUTH EVERY DAY   meclizine 12.5 MG tablet Commonly known as: ANTIVERT Take 1 tablet  (12.5 mg total) by mouth 3 (three) times daily as needed for dizziness.   triamcinolone cream 0.1 % Commonly known as: KENALOG Apply 1 Application topically 2 (two) times daily.        Allergies:  Allergies  Allergen Reactions   Pollen Extract     Family History: Family History  Problem Relation Age of Onset   Alcohol abuse Mother    Mental illness Sister    Squamous cell carcinoma Father    Colon cancer Neg Hx    Colon polyps Neg Hx    Esophageal cancer Neg Hx    Stomach cancer Neg Hx    Rectal cancer Neg Hx     Social History:  reports that he has never smoked. He has never used smokeless tobacco. He reports current alcohol use. He reports that he does not use drugs.   Physical Exam: BP 131/79   Pulse 81   Constitutional:  Alert and oriented, No acute distress. HEENT: [place] AT, moist mucus membranes.  Trachea midline, no masses. Cardiovascular: No clubbing, cyanosis, or edema. Respiratory: Normal respiratory effort, no increased work of breathing. GI: Abdomen is soft, nontender, nondistended, no abdominal masses GU: Mild scrotal tenderness overlying the left testicle but otherwise normal testicles, no abnormal structures identified, no remarkable hernias.  Normal penis. Skin: No rashes, bruises or suspicious lesions. Neurologic: Grossly intact, no focal deficits, moving all 4 extremities. Psychiatric: Normal mood and affect.    Assessment & Plan:    1. Left testicular, lower quadrant pain Differential includes chronic testicular pain, nerve irritation, injury, ureteral stone There is no pathology on exam, fairly reassuring other than mild tenderness and scrotal ultrasound was also anatomically reassuring. Given his history of associated urinary frequency, left lower quadrant pain, and history of kidney stones, its important to rule out UVJ stone or distal ureteral stone, plan for KUB today. If that is non-diagnostic, consider non-contrast CT scan.  If his pain  fails to resolve with his recommended scrotal support with NSAIDs and tight underwear and can't find any underlying anatomic issuesm we'll consider referral to enterology, Dr. Radene Journey.  2. Urinary frequency KUB ordered today. Acute over the past 2 weeks. He does have 3+glucose in his urine today and denies being a diabetic. Suspicious that he perhaps is and this could be a causative factor. We'll check hemoglobin A1c today, and if it is abnormal, have him follow Dr. Birdie Sons.   I have reviewed the above documentation for accuracy and completeness, and I agree with the above.   Vanna Scotland, MD  [facility] [address] [place], [place] [address] [phone]  I spent 47 total minutes on the day of the encounter including pre-visit review of the medical record, face-to-face time with the patient, and post visit ordering of labs/imaging/tests.

## 2023-07-02 LAB — HEMOGLOBIN A1C
Est. average glucose Bld gHb Est-mCnc: 194 mg/dL
Hgb A1c MFr Bld: 8.4 % — ABNORMAL HIGH (ref 4.8–5.6)

## 2023-07-03 ENCOUNTER — Telehealth: Payer: Self-pay | Admitting: Family Medicine

## 2023-07-03 NOTE — Telephone Encounter (Signed)
-----   Message from Vanna Scotland sent at 07/02/2023  8:26 AM EDT ----- Mr. Joseph Booth was in my office for follow-up testicular pain.  He had mentioned that he was having marked increased urinary frequency over the past several weeks and he had 3+ glucose in his urine.  I ended up checking a hemoglobin A1c due to my level of suspicion for diabetes and it is in fact 8.4.  Hoping you can follow-up with him.  I will let him know.  Vanna Scotland, MD

## 2023-07-03 NOTE — Telephone Encounter (Signed)
Patient is scheduled for Wednesday 07/15/23.

## 2023-07-03 NOTE — Telephone Encounter (Signed)
Please contact the patient.  His A1c had worsened when checked by urology.  I would like to see him in the office in the next week or 2 to discuss this finding.

## 2023-07-10 ENCOUNTER — Encounter: Payer: Self-pay | Admitting: Ophthalmology

## 2023-07-15 ENCOUNTER — Encounter: Payer: Self-pay | Admitting: Family Medicine

## 2023-07-15 ENCOUNTER — Ambulatory Visit: Payer: BC Managed Care – PPO | Admitting: Family Medicine

## 2023-07-15 VITALS — BP 118/74 | HR 75 | Temp 98.2°F | Ht 71.0 in | Wt 233.0 lb

## 2023-07-15 DIAGNOSIS — R7309 Other abnormal glucose: Secondary | ICD-10-CM

## 2023-07-15 DIAGNOSIS — E1165 Type 2 diabetes mellitus with hyperglycemia: Secondary | ICD-10-CM | POA: Insufficient documentation

## 2023-07-15 HISTORY — DX: Type 2 diabetes mellitus with hyperglycemia: E11.65

## 2023-07-15 LAB — GLUCOSE, POCT (MANUAL RESULT ENTRY): POC Glucose: 172 mg/dL — AB (ref 70–99)

## 2023-07-15 MED ORDER — BLOOD GLUCOSE MONITORING SUPPL DEVI
1.0000 | Freq: Every day | 0 refills | Status: AC
Start: 2023-07-15 — End: ?

## 2023-07-15 MED ORDER — LANCETS MISC. MISC: 1.0000 | Freq: Every day | 0 refills | Status: DC

## 2023-07-15 MED ORDER — BLOOD GLUCOSE TEST VI STRP
1.0000 | ORAL_STRIP | Freq: Every day | 0 refills | Status: DC
Start: 2023-07-15 — End: 2023-10-19

## 2023-07-15 MED ORDER — LANCET DEVICE MISC
1.0000 | Freq: Every day | 0 refills | Status: DC
Start: 2023-07-15 — End: 2023-10-19

## 2023-07-15 NOTE — Assessment & Plan Note (Addendum)
Single A1c elevation.  Glucose is elevated indicating patient has diabetes.  He is hesitant to start on any medication for this.  At this time he we will work on continued dietary changes and increasing his exercise.  He will follow-up in 3 months and we will recheck an A1c at that time. He will start checking his glucose fasting a couple times per week.

## 2023-07-15 NOTE — Progress Notes (Signed)
Marikay Alar, MD Phone: [phone]  Joseph Booth is a 62 y.o. male who presents today for f/u.  Elevated A1c: Patient was at urology recently and they checked an A1c given polyuria.  A1c was 8.4.  Since then patient has cut out sweets and sugary drinks.  He remains active with lots of walking through work and lots of physical activity at home though no specific exercise routine.  He notes since cutting down on sweets his polyuria has improved back to normal and his nocturia has gone from 3-4 times a night to 1 time per night.  Patient notes he feels better in general as well.  Social History   Tobacco Use  Smoking Status Never  Smokeless Tobacco Never    Current Outpatient Medications on File Prior to Visit  Medication Sig Dispense Refill   Ascorbic Acid 500 MG CHEW Chew 2 each by mouth daily.     cetirizine (ZYRTEC) 10 MG tablet Take 1 tablet (10 mg total) by mouth daily as needed for allergies. 90 tablet 3   esomeprazole (NEXIUM) 20 MG capsule Take 20-40 mg by mouth daily as needed (acid reflux).     losartan (COZAAR) 50 MG tablet TAKE 1 TABLET BY MOUTH EVERY DAY 90 tablet 0   meclizine (ANTIVERT) 12.5 MG tablet Take 1 tablet (12.5 mg total) by mouth 3 (three) times daily as needed for dizziness. 30 tablet 0   triamcinolone cream (KENALOG) 0.1 % Apply 1 Application topically 2 (two) times daily. 30 g 0   No current facility-administered medications on file prior to visit.     ROS see history of present illness  Objective  Physical Exam Vitals:   07/15/23 0812  BP: 118/74  Pulse: 75  Temp: 98.2 F (36.8 C)  SpO2: 95%    BP Readings from Last 3 Encounters:  07/15/23 118/74  07/01/23 131/79  06/08/23 (!) 157/96   Wt Readings from Last 3 Encounters:  07/15/23 233 lb (105.7 kg)  06/08/23 235 lb (106.6 kg)  05/04/23 244 lb (110.7 kg)    Physical Exam Constitutional:      General: He is not in acute distress.    Appearance: He is not diaphoretic.   Cardiovascular:     Rate and Rhythm: Normal rate and regular rhythm.     Heart sounds: Normal heart sounds.  Pulmonary:     Effort: Pulmonary effort is normal.     Breath sounds: Normal breath sounds.  Skin:    General: Skin is warm and dry.  Neurological:     Mental Status: He is alert.      Assessment/Plan: Please see individual problem list.  Elevated hemoglobin A1c Assessment & Plan: Single A1c elevation.  Glucose is elevated indicating patient has diabetes.  He is hesitant to start on any medication for this.  At this time he we will work on continued dietary changes and increasing his exercise.  He will follow-up in 3 months and we will recheck an A1c at that time. He will start checking his glucose fasting a couple times per week.   Orders: -     POCT glucose (manual entry) -     Blood Glucose Monitoring Suppl; 1 each by Does not apply route daily. May substitute to any manufacturer covered by patient's insurance.  Dispense: 1 each; Refill: 0 -     Blood Glucose Test; 1 each by In Vitro route daily. May substitute to any manufacturer covered by patient's insurance.  Dispense: 100 strip; Refill: 0 -  Lancet Device; 1 each by Does not apply route daily. May substitute to any manufacturer covered by patient's insurance.  Dispense: 1 each; Refill: 0 -     Lancets Misc.; 1 each by Does not apply route daily. May substitute to any manufacturer covered by patient's insurance.  Dispense: 100 each; Refill: 0     Return in about 3 months (around 10/14/2023) for elevated a1c.   Marikay Alar, MD [facility]

## 2023-07-21 NOTE — Discharge Instructions (Signed)

## 2023-07-22 ENCOUNTER — Ambulatory Visit
Admission: RE | Admit: 2023-07-22 | Discharge: 2023-07-22 | Disposition: A | Discharge status: Home / Self Care | Payer: BC Managed Care – PPO | Attending: Ophthalmology | Admitting: Ophthalmology

## 2023-07-22 ENCOUNTER — Ambulatory Visit: Payer: BC Managed Care – PPO | Admitting: Anesthesiology

## 2023-07-22 ENCOUNTER — Encounter
Admission: RE | Disposition: A | Discharge status: Home / Self Care | Payer: Self-pay | Source: Home / Self Care | Attending: Ophthalmology

## 2023-07-22 ENCOUNTER — Encounter: Payer: Self-pay | Admitting: Ophthalmology

## 2023-07-22 ENCOUNTER — Other Ambulatory Visit: Payer: Self-pay

## 2023-07-22 DIAGNOSIS — H2512 Age-related nuclear cataract, left eye: Secondary | ICD-10-CM | POA: Diagnosis present

## 2023-07-22 DIAGNOSIS — F419 Anxiety disorder, unspecified: Secondary | ICD-10-CM | POA: Diagnosis not present

## 2023-07-22 DIAGNOSIS — E782 Mixed hyperlipidemia: Secondary | ICD-10-CM | POA: Diagnosis not present

## 2023-07-22 DIAGNOSIS — Z87442 Personal history of urinary calculi: Secondary | ICD-10-CM | POA: Insufficient documentation

## 2023-07-22 DIAGNOSIS — Z8711 Personal history of peptic ulcer disease: Secondary | ICD-10-CM | POA: Insufficient documentation

## 2023-07-22 DIAGNOSIS — K219 Gastro-esophageal reflux disease without esophagitis: Secondary | ICD-10-CM | POA: Insufficient documentation

## 2023-07-22 DIAGNOSIS — I10 Essential (primary) hypertension: Secondary | ICD-10-CM | POA: Insufficient documentation

## 2023-07-22 HISTORY — PX: CATARACT EXTRACTION W/PHACO: SHX586

## 2023-07-22 SURGERY — PHACOEMULSIFICATION, CATARACT, WITH IOL INSERTION
Anesthesia: Monitor Anesthesia Care | Laterality: Left

## 2023-07-22 MED ORDER — SIGHTPATH DOSE#1 BSS IO SOLN
INTRAOCULAR | Status: DC | PRN
  Administered 2023-07-22: 15 mL via INTRAOCULAR

## 2023-07-22 MED ORDER — BRIMONIDINE TARTRATE-TIMOLOL 0.2-0.5 % OP SOLN
OPHTHALMIC | Status: DC | PRN
  Administered 2023-07-22: 1 [drp] via OPHTHALMIC

## 2023-07-22 MED ORDER — ARMC OPHTHALMIC DILATING DROPS
1.0000 | OPHTHALMIC | Status: DC | PRN
  Administered 2023-07-22 (×3): 1 via OPHTHALMIC

## 2023-07-22 MED ORDER — MIDAZOLAM HCL 2 MG/2ML IJ SOLN
INTRAMUSCULAR | Status: DC | PRN
  Administered 2023-07-22: 2 mg via INTRAVENOUS

## 2023-07-22 MED ORDER — LACTATED RINGERS IV SOLN: INTRAVENOUS | Status: DC

## 2023-07-22 MED ORDER — TETRACAINE HCL 0.5 % OP SOLN
1.0000 [drp] | OPHTHALMIC | Status: DC | PRN
  Administered 2023-07-22 (×3): 1 [drp] via OPHTHALMIC

## 2023-07-22 MED ORDER — CEFUROXIME OPHTHALMIC INJECTION 1 MG/0.1 ML
INJECTION | OPHTHALMIC | Status: DC | PRN
  Administered 2023-07-22: 1 mg via INTRACAMERAL

## 2023-07-22 MED ORDER — SIGHTPATH DOSE#1 BSS IO SOLN
INTRAOCULAR | Status: DC | PRN
  Administered 2023-07-22: 94 mL via OPHTHALMIC

## 2023-07-22 MED ORDER — SIGHTPATH DOSE#1 NA HYALUR & NA CHOND-NA HYALUR IO KIT
PACK | INTRAOCULAR | Status: DC | PRN
  Administered 2023-07-22: 1 via OPHTHALMIC

## 2023-07-22 MED ORDER — SIGHTPATH DOSE#1 BSS IO SOLN
INTRAOCULAR | Status: DC | PRN
  Administered 2023-07-22: 2 mL

## 2023-07-22 SURGICAL SUPPLY — 9 items
CATARACT SUITE SIGHTPATH (MISCELLANEOUS) ×1
FEE CATARACT SUITE SIGHTPATH (MISCELLANEOUS) ×1 IMPLANT
GLOVE SRG 8 PF TXTR STRL LF DI (GLOVE) ×1 IMPLANT
GLOVE SURG ENC TEXT LTX SZ7.5 (GLOVE) ×1 IMPLANT
GLOVE SURG UNDER POLY LF SZ8 (GLOVE) ×1
LENS IOL TECNIS EYHANCE 23.0 (Intraocular Lens) IMPLANT
NDL FILTER BLUNT 18X1 1/2 (NEEDLE) ×1 IMPLANT
NEEDLE FILTER BLUNT 18X1 1/2 (NEEDLE) ×1
SYR 3ML LL SCALE MARK (SYRINGE) ×1 IMPLANT

## 2023-07-22 NOTE — H&P (Signed)
[facility]   Primary Care Physician:  Glori Luis, MD Ophthalmologist: Dr. Lockie Mola  Pre-Procedure History & Physical: HPI:  Joseph Booth is a 62 y.o. male here for ophthalmic surgery.   Past Medical History:  Diagnosis Date   Basal cell carcinoma 02/20/2020   right lat forehead   Bladder stones    Chronic gastritis 02/2018   GERD (gastroesophageal reflux disease)    History of gastric ulcer    History of kidney stones    History of stress test 02-09-2018   dr Gwen Pounds   normal stress echo, normal RVSF, mild TR   Hypertension    cardiologist-  dr Gwen Pounds [facility])   Mixed hyperlipidemia    Seasonal allergies     Past Surgical History:  Procedure Laterality Date   CLOSED MANIPULATION SHOULDER WITH STERIOD INJECTION Left 09/20/2019   Procedure: CLOSED MANIPULATION SHOULDER WITH STEROID INJECTION;  Surgeon: Christena Flake, MD;  Location: [facility];  Service: Orthopedics;  Laterality: Left;   COLONOSCOPY  last one 2016   CYSTOSCOPY WITH LITHOLAPAXY Right 07/14/2018   Procedure: CYSTOSCOPY WITH RIGHT RETROGRADE PYELOGRAM, RIGHT URETEROSCOPY WITH LASER LITHOTRIPSY AND STONE BASKETTING AND STENT PLACEMENT;  Surgeon: Rene Paci, MD;  Location: [facility];  Service: Urology;  Laterality: Right;   EYE SURGERY     lasik   SHOULDER ARTHROSCOPY WITH OPEN ROTATOR CUFF REPAIR Left 04/21/2019   Procedure: SHOULDER ARTHROSCOPY WITH OPEN ROTATOR CUFF REPAIR;  Surgeon: Christena Flake, MD;  Location: [facility];  Service: Orthopedics;  Laterality: Left;   TONSILLECTOMY  1969   TYMPANOPLASTY Right 2000   UPPER GASTROINTESTINAL ENDOSCOPY  last one 02-24-2018   WRIST GANGLION EXCISION Left ?    Prior to Admission medications   Medication Sig Start Date End Date Taking? Authorizing Provider  Ascorbic Acid 500 MG CHEW Chew 2 each by mouth daily.   Yes [provider]  cetirizine (ZYRTEC) 10 MG tablet Take 1 tablet (10 mg  total) by mouth daily as needed for allergies. 05/04/23  Yes Glori Luis, MD  esomeprazole (NEXIUM) 20 MG capsule Take 20-40 mg by mouth daily as needed (acid reflux).   Yes [provider]  losartan (COZAAR) 50 MG tablet TAKE 1 TABLET BY MOUTH EVERY DAY 04/29/23  Yes Glori Luis, MD  triamcinolone cream (KENALOG) 0.1 % Apply 1 Application topically 2 (two) times daily. 05/04/23  Yes Glori Luis, MD  Blood Glucose Monitoring Suppl DEVI 1 each by Does not apply route daily. May substitute to any manufacturer covered by patient's insurance. 07/15/23   Glori Luis, MD  Glucose Blood (BLOOD GLUCOSE TEST STRIPS) STRP 1 each by In Vitro route daily. May substitute to any manufacturer covered by patient's insurance. 07/15/23   Glori Luis, MD  Lancet Device MISC 1 each by Does not apply route daily. May substitute to any manufacturer covered by patient's insurance. 07/15/23   Glori Luis, MD  Lancets Misc. MISC 1 each by Does not apply route daily. May substitute to any manufacturer covered by patient's insurance. 07/15/23   Glori Luis, MD  meclizine (ANTIVERT) 12.5 MG tablet Take 1 tablet (12.5 mg total) by mouth 3 (three) times daily as needed for dizziness. 12/26/22   Immordino, Jeannett Senior, FNP    Allergies as of 07/08/2023 - Review Complete 07/01/2023  Allergen Reaction Noted   Pollen extract  10/22/2014    Family History  Problem Relation Age of Onset  Alcohol abuse Mother    Mental illness Sister    Squamous cell carcinoma Father    Colon cancer Neg Hx    Colon polyps Neg Hx    Esophageal cancer Neg Hx    Stomach cancer Neg Hx    Rectal cancer Neg Hx     Social History   Socioeconomic History   Marital status: Married    Spouse name: Not on file   Number of children: 2   Years of education: 16   Highest education level: Not on file  Occupational History   Occupation: Stage manager  Tobacco Use   Smoking status: Never   Smokeless tobacco:  Never  Vaping Use   Vaping status: Never Used  Substance and Sexual Activity   Alcohol use: Yes    Comment: occ   Drug use: No   Sexual activity: Not on file  Other Topics Concern   Not on file  Social History Narrative   Not on file   Social Determinants of Health   Financial Resource Strain: Patient Declined (04/30/2023)   Overall Financial Resource Strain (CARDIA)    Difficulty of Paying Living Expenses: Patient declined  Food Insecurity: Patient Declined (04/30/2023)   Hunger Vital Sign    Worried About Running Out of Food in the Last Year: Patient declined    Ran Out of Food in the Last Year: Patient declined  Transportation Needs: Patient Declined (04/30/2023)   PRAPARE - Administrator, Civil Service (Medical): Patient declined    Lack of Transportation (Non-Medical): Patient declined  Physical Activity: Insufficiently Active (04/30/2023)   Exercise Vital Sign    Days of Exercise per Week: 5 days    Minutes of Exercise per Session: 20 min  Stress: No Stress Concern Present (04/30/2023)   [employer] of Occupational Health - Occupational Stress Questionnaire    Feeling of Stress : Not at all  Social Connections: Unknown (04/30/2023)   Social Connection and Isolation Panel [NHANES]    Frequency of Communication with Friends and Family: Patient declined    Frequency of Social Gatherings with Friends and Family: Patient declined    Attends Religious Services: Patient declined    Database administrator or Organizations: Patient declined    Attends Engineer, structural: Not on file    Marital Status: Patient declined  Catering manager Violence: Not on file    Review of Systems: See HPI, otherwise negative ROS  Physical Exam: BP (!) 143/89   Pulse 75   Temp 97.7 F (36.5 C) (Temporal)   Resp 14   Ht 5\' 11"  (1.803 m)   Wt 104.8 kg   SpO2 94%   BMI 32.22 kg/m  General:   Alert,  pleasant and cooperative in NAD Head:  Normocephalic and  atraumatic. Lungs:  Clear to auscultation.    Heart:  Regular rate and rhythm.   Impression/Plan: Joseph Booth is here for ophthalmic surgery.  Risks, benefits, limitations, and alternatives regarding ophthalmic surgery have been reviewed with the patient.  Questions have been answered.  All parties agreeable.   Lockie Mola, MD  07/22/2023, 8:09 AM

## 2023-07-22 NOTE — Transfer of Care (Signed)
Immediate Anesthesia Transfer of Care Note  Patient: Joseph Booth  Procedure(s) Performed: CATARACT EXTRACTION PHACO AND INTRAOCULAR LENS PLACEMENT (IOC) LEFT DIABETIC  10.42 00:41.7 (Left)  Patient Location: PACU  Anesthesia Type: MAC  Level of Consciousness: awake, alert  and patient cooperative  Airway and Oxygen Therapy: Patient Spontanous Breathing and Patient connected to supplemental oxygen  Post-op Assessment: Post-op Vital signs reviewed, Patient's Cardiovascular Status Stable, Respiratory Function Stable, Patent Airway and No signs of Nausea or vomiting  Post-op Vital Signs: Reviewed and stable  Complications: No notable events documented.

## 2023-07-22 NOTE — Anesthesia Postprocedure Evaluation (Signed)
Anesthesia Post Note  Patient: Joseph Booth  Procedure(s) Performed: CATARACT EXTRACTION PHACO AND INTRAOCULAR LENS PLACEMENT (IOC) LEFT DIABETIC  10.42 00:41.7 (Left)  Patient location during evaluation: PACU Anesthesia Type: MAC Level of consciousness: awake and alert Pain management: pain level controlled Vital Signs Assessment: post-procedure vital signs reviewed and stable Respiratory status: spontaneous breathing, nonlabored ventilation, respiratory function stable and patient connected to nasal cannula oxygen Cardiovascular status: stable and blood pressure returned to baseline Postop Assessment: no apparent nausea or vomiting Anesthetic complications: no   No notable events documented.   Last Vitals:  Vitals:   07/22/23 0852 07/22/23 0900  BP: 133/77 122/79  Pulse: 64 67  Resp: 17 16  Temp: (!) 36.1 C   SpO2: 94% 97%    Last Pain:  Vitals:   07/22/23 0900  TempSrc:   PainSc: 0-No pain                 Lenard Simmer

## 2023-07-22 NOTE — Op Note (Signed)
OPERATIVE NOTE  Joseph Booth [id number] 07/22/2023   PREOPERATIVE DIAGNOSIS:  Nuclear sclerotic cataract left eye. H25.12   POSTOPERATIVE DIAGNOSIS:    Nuclear sclerotic cataract left eye.     PROCEDURE:  Phacoemusification with posterior chamber intraocular lens placement of the left eye  Ultrasound time: Procedure(s): CATARACT EXTRACTION PHACO AND INTRAOCULAR LENS PLACEMENT (IOC) LEFT DIABETIC  10.42 00:41.7 (Left)  LENS:   Implant Name Type Inv. Item Serial No. Manufacturer Lot No. LRB No. Used Action  LENS IOL TECNIS EYHANCE 23.0 - [id number] Intraocular Lens LENS IOL TECNIS EYHANCE 23.0 [id number] [employer]  Left 1 Implanted      SURGEON:  Deirdre Evener, MD   ANESTHESIA:  Topical with tetracaine drops and 2% Xylocaine jelly, augmented with 1% preservative-free intracameral lidocaine.    COMPLICATIONS:  None.   DESCRIPTION OF PROCEDURE:  The patient was identified in the holding room and transported to the operating room and placed in the supine position under the operating microscope.  The left eye was identified as the operative eye and it was prepped and draped in the usual sterile ophthalmic fashion.   A 1 millimeter clear-corneal paracentesis was made at the 1:30 position.  0.5 ml of preservative-free 1% lidocaine was injected into the anterior chamber.  The anterior chamber was filled with Viscoat viscoelastic.  A 2.4 millimeter keratome was used to make a near-clear corneal incision at the 10:30 position.  .  A curvilinear capsulorrhexis was made with a cystotome and capsulorrhexis forceps.  Balanced salt solution was used to hydrodissect and hydrodelineate the nucleus.   Phacoemulsification was then used in stop and chop fashion to remove the lens nucleus and epinucleus.  The remaining cortex was then removed using the irrigation and aspiration handpiece. Provisc was then placed into the capsular bag to distend it for lens placement.  A lens was then injected into  the capsular bag.  The remaining viscoelastic was aspirated.   Wounds were hydrated with balanced salt solution.  The anterior chamber was inflated to a physiologic pressure with balanced salt solution.  No wound leaks were noted. Cefuroxime 0.1 ml of a 10mg /ml solution was injected into the anterior chamber for a dose of 1 mg of intracameral antibiotic at the completion of the case.   Timolol and Brimonidine drops were applied to the eye.  The patient was taken to the recovery room in stable condition without complications of anesthesia or surgery.  [name] 07/22/2023, 8:51 AM

## 2023-07-22 NOTE — Anesthesia Preprocedure Evaluation (Signed)
Anesthesia Evaluation  Patient identified by MRN, date of birth, ID band Patient awake    Reviewed: Allergy & Precautions, NPO status , Patient's Chart, lab work & pertinent test results  History of Anesthesia Complications Negative for: history of anesthetic complications  Airway Mallampati: II  TM Distance: >3 FB Neck ROM: Full    Dental  (+) Dental Advidsory Given   Pulmonary neg pulmonary ROS, neg shortness of breath, neg sleep apnea, neg COPD, neg recent URI   breath sounds clear to auscultation- rhonchi (-) wheezing      Cardiovascular hypertension, Pt. on medications (-) angina (-) CAD, (-) Past MI, (-) Cardiac Stents and (-) CABG  Rhythm:Regular Rate:Normal - Systolic murmurs and - Diastolic murmurs    Neuro/Psych neg Seizures PSYCHIATRIC DISORDERS Anxiety     negative neurological ROS     GI/Hepatic Neg liver ROS,GERD  ,,  Endo/Other  negative endocrine ROSneg diabetes    Renal/GU Renal disease (hx of nephrolithiasis)     Musculoskeletal negative musculoskeletal ROS (+)    Abdominal  (+) + obese  Peds  Hematology negative hematology ROS (+)   Anesthesia Other Findings Past Medical History: No date: Bladder stones 02/2018: Chronic gastritis No date: GERD (gastroesophageal reflux disease) No date: History of gastric ulcer No date: History of kidney stones 02-09-2018   dr Gwen Pounds: History of stress test     Comment:  normal stress echo, normal RVSF, mild TR No date: Hypertension     Comment:  cardiologist-  dr Gwen Pounds ([facility]) No date: Mixed hyperlipidemia No date: Nephrolithiasis     Comment:  per renal ultrasound 02-19-2018 right renal stone               nonobstructive No date: Seasonal allergies   Reproductive/Obstetrics                             Anesthesia Physical Anesthesia Plan  ASA: 2  Anesthesia Plan: MAC   Post-op Pain Management:  Regional  for Post-op pain   Induction: Intravenous  PONV Risk Score and Plan: 1 and Midazolam and Treatment may vary due to age or medical condition  Airway Management Planned: Natural Airway and Nasal Cannula  Additional Equipment:   Intra-op Plan:   Post-operative Plan:   Informed Consent: I have reviewed the patients History and Physical, chart, labs and discussed the procedure including the risks, benefits and alternatives for the proposed anesthesia with the patient or authorized representative who has indicated his/her understanding and acceptance.     Dental advisory given  Plan Discussed with: CRNA and Anesthesiologist  Anesthesia Plan Comments:         Anesthesia Quick Evaluation

## 2023-07-28 ENCOUNTER — Encounter: Payer: Self-pay | Admitting: Gastroenterology

## 2023-07-28 ENCOUNTER — Other Ambulatory Visit: Payer: Self-pay | Admitting: Family Medicine

## 2023-07-28 DIAGNOSIS — I10 Essential (primary) hypertension: Secondary | ICD-10-CM

## 2023-08-05 ENCOUNTER — Encounter: Payer: Self-pay | Admitting: Gastroenterology

## 2023-10-19 ENCOUNTER — Encounter: Payer: Self-pay | Admitting: Family Medicine

## 2023-10-19 ENCOUNTER — Ambulatory Visit: Payer: BC Managed Care – PPO | Admitting: Family Medicine

## 2023-10-19 ENCOUNTER — Other Ambulatory Visit: Payer: Self-pay | Admitting: Family Medicine

## 2023-10-19 VITALS — BP 124/78 | HR 94 | Temp 99.1°F | Ht 71.0 in | Wt 213.0 lb

## 2023-10-19 DIAGNOSIS — E1165 Type 2 diabetes mellitus with hyperglycemia: Secondary | ICD-10-CM

## 2023-10-19 DIAGNOSIS — K219 Gastro-esophageal reflux disease without esophagitis: Secondary | ICD-10-CM | POA: Diagnosis not present

## 2023-10-19 DIAGNOSIS — I10 Essential (primary) hypertension: Secondary | ICD-10-CM | POA: Diagnosis not present

## 2023-10-19 DIAGNOSIS — M79644 Pain in right finger(s): Secondary | ICD-10-CM | POA: Diagnosis not present

## 2023-10-19 LAB — BASIC METABOLIC PANEL
BUN: 20 mg/dL (ref 6–23)
CO2: 27 meq/L (ref 19–32)
Calcium: 9.1 mg/dL (ref 8.4–10.5)
Chloride: 105 meq/L (ref 96–112)
Creatinine, Ser: 1.09 mg/dL (ref 0.40–1.50)
GFR: 74.03 mL/min (ref >60.00)
Glucose, Bld: 112 mg/dL — ABNORMAL HIGH (ref 70–99)
Potassium: 4.3 meq/L (ref 3.5–5.1)
Sodium: 140 meq/L (ref 135–145)

## 2023-10-19 LAB — HEMOGLOBIN A1C: Hgb A1c MFr Bld: 4.2 % — ABNORMAL LOW (ref 4.6–6.5)

## 2023-10-19 MED ORDER — BLOOD GLUCOSE TEST VI STRP: 1.0000 | ORAL_STRIP | Freq: Every day | 3 refills | Status: AC

## 2023-10-19 MED ORDER — LANCET DEVICE MISC: 1.0000 | Freq: Every day | 3 refills | Status: AC

## 2023-10-19 MED ORDER — LANCETS MISC. MISC: 1.0000 | Freq: Every day | 3 refills | Status: AC

## 2023-10-19 NOTE — Assessment & Plan Note (Signed)
Patient reports symptoms have resolved with weight loss.  He will monitor.  No longer on medication.

## 2023-10-19 NOTE — Assessment & Plan Note (Signed)
Concern for possible tendinous injury previously.  Suspect his tendon injury in the right fourth finger has healed.  We are going to refer to a hand surgeon given that he has persistent pain.

## 2023-10-19 NOTE — Progress Notes (Signed)
Marikay Alar, MD Phone: [phone]  Joseph Booth is a 62 y.o. male who presents today for f/u.  HYPERTENSION Disease Monitoring Home BP Monitoring not checking Chest pain- no    Dyspnea- no Medications Compliance-  taking losartan.   Edema- no BMET    Component Value Date/Time   NA 137 06/08/2023 1203   K 4.3 06/08/2023 1203   CL 104 06/08/2023 1203   CO2 25 06/08/2023 1203   GLUCOSE 224 (H) 06/08/2023 1203   BUN 15 06/08/2023 1203   CREATININE 1.09 06/08/2023 1203   CALCIUM 8.9 06/08/2023 1203   GFRNONAA >60 06/08/2023 1203   GFRAA >60 02/03/2018 1828   DIABETES Disease Monitoring: Blood Sugar ranges-average fasting for the last 90 days is 121 polyuria/phagia/dipsia-no      Not currently on medication.  He is avoiding refined sugar.  He does walk some for exercise but not as much as he should.  Fall: Patient notes about 6 weeks ago he tripped over a root going backwards and landed on his right hand.  Since then he has had some issues with his right fourth and fifth fingers.  He notes his right fourth finger distaly would not extend though that has resolved.  He has a slight bump on the dorsal aspect of his right fourth middle phalanx.  He notes some pain on hyperextension of the fifth finger that is still occurring.  Social History   Tobacco Use  Smoking Status Never  Smokeless Tobacco Never    Current Outpatient Medications on File Prior to Visit  Medication Sig Dispense Refill   Blood Glucose Monitoring Suppl DEVI 1 each by Does not apply route daily. May substitute to any manufacturer covered by patient's insurance. 1 each 0   cetirizine (ZYRTEC) 10 MG tablet Take 1 tablet (10 mg total) by mouth daily as needed for allergies. 90 tablet 3   losartan (COZAAR) 50 MG tablet TAKE 1 TABLET BY MOUTH EVERY DAY 90 tablet 0   meclizine (ANTIVERT) 12.5 MG tablet Take 1 tablet (12.5 mg total) by mouth 3 (three) times daily as needed for dizziness. 30 tablet 0   triamcinolone  cream (KENALOG) 0.1 % Apply 1 Application topically 2 (two) times daily. 30 g 0   No current facility-administered medications on file prior to visit.     ROS see history of present illness  Objective  Physical Exam Vitals:   10/19/23 0852  BP: 124/78  Pulse: 94  Temp: 99.1 F (37.3 C)  SpO2: 94%    BP Readings from Last 3 Encounters:  10/19/23 124/78  07/22/23 122/79  07/15/23 118/74   Wt Readings from Last 3 Encounters:  10/19/23 213 lb (96.6 kg)  07/22/23 231 lb (104.8 kg)  07/15/23 233 lb (105.7 kg)    Physical Exam Constitutional:      General: He is not in acute distress.    Appearance: He is not diaphoretic.  Cardiovascular:     Rate and Rhythm: Normal rate and regular rhythm.     Heart sounds: Normal heart sounds.  Pulmonary:     Effort: Pulmonary effort is normal.     Breath sounds: Normal breath sounds.  Musculoskeletal:     Comments: Patient is able to fully extend all right fingers, slight lump on the dorsal aspect of the right fourth middle phalanx that is nontender, intact strength on extension and flexion of the right fourth and fifth fingers, no tenderness of the right fourth or fifth fingers or ulnar aspect of the  right hand, intact range of motion right fourth and fifth fingers  Skin:    General: Skin is warm and dry.  Neurological:     Mental Status: He is alert.    Diabetic Foot Exam - Simple   Simple Foot Form Diabetic Foot exam was performed with the following findings: Yes 10/19/2023  9:07 AM  Visual Inspection No deformities, no ulcerations, no other skin breakdown bilaterally: Yes Sensation Testing Intact to touch and monofilament testing bilaterally: Yes Pulse Check Posterior Tibialis and Dorsalis pulse intact bilaterally: Yes Comments      Assessment/Plan: Please see individual problem list.  Essential hypertension Assessment & Plan: Chronic.  Adequately controlled.  He will continue losartan 50 mg daily.  Orders: -      Basic metabolic panel  Gastroesophageal reflux disease, unspecified whether esophagitis present Assessment & Plan: Patient reports symptoms have resolved with weight loss.  He will monitor.  No longer on medication.   Type 2 diabetes mellitus with hyperglycemia, without long-term current use of insulin (HCC) Assessment & Plan: Chronic issue.  Encouraged healthy diet and exercise.  We will check an A1c today.  Orders: -     Hemoglobin A1c -     Lancet Device; 1 each by Does not apply route daily. May substitute to any manufacturer covered by patient's insurance.  Dispense: 1 each; Refill: 3 -     Lancets Misc.; 1 each by Does not apply route daily. May substitute to any manufacturer covered by patient's insurance.  Dispense: 100 each; Refill: 3 -     Blood Glucose Test; 1 each by In Vitro route daily. May substitute to any manufacturer covered by patient's insurance.  Dispense: 100 strip; Refill: 3  Finger pain, right Assessment & Plan: Concern for possible tendinous injury previously.  Suspect his tendon injury in the right fourth finger has healed.  We are going to refer to a hand surgeon given that he has persistent pain.  Orders: -     Ambulatory referral to Orthopedic Surgery   Health maintenance: Encouraged patient to see the eye doctor once yearly for diabetic eye exam.  Return in about 6 months (around 04/18/2024) for transfer of care.   Marikay Alar, MD [facility]

## 2023-10-19 NOTE — Assessment & Plan Note (Signed)
Chronic.  Adequately controlled.  He will continue losartan 50 mg daily.

## 2023-10-19 NOTE — Assessment & Plan Note (Signed)
Chronic issue.  Encouraged healthy diet and exercise.  We will check an A1c today.

## 2023-10-20 ENCOUNTER — Telehealth: Payer: Self-pay

## 2023-10-20 NOTE — Telephone Encounter (Signed)
Pt called back and I read the note and he stated he would have to make the lab appointment when he gets back from his cruise. Pt stated it will be next year when he will be able to make the appointment

## 2023-10-20 NOTE — Telephone Encounter (Signed)
Lvm for pt to give office a call back in regards to lab results

## 2023-10-20 NOTE — Telephone Encounter (Signed)
-----   Message from Marikay Alar sent at 10/19/2023  3:49 PM EST ----- Please let the patient know that his A1c is 4.2.  I am not sure that this is accurate given the fasting glucoses he has been getting.  I would suggest we bring him back to check a fructosamine level as a confirmatory test of his A1c level.  Order placed.

## 2023-10-21 NOTE — Telephone Encounter (Signed)
Noted.  Please go ahead and get him scheduled for his labs.  Thanks.

## 2023-10-28 ENCOUNTER — Other Ambulatory Visit: Payer: Self-pay | Admitting: Family Medicine

## 2023-10-28 DIAGNOSIS — I10 Essential (primary) hypertension: Secondary | ICD-10-CM

## 2023-11-06 DIAGNOSIS — S63659A Sprain of metacarpophalangeal joint of unspecified finger, initial encounter: Secondary | ICD-10-CM

## 2023-11-06 HISTORY — DX: Sprain of metacarpophalangeal joint of unspecified finger, initial encounter: S63.659A

## 2023-11-13 ENCOUNTER — Telehealth: Payer: Self-pay | Admitting: *Deleted

## 2023-11-13 ENCOUNTER — Ambulatory Visit (AMBULATORY_SURGERY_CENTER): Payer: 59 | Admitting: *Deleted

## 2023-11-13 VITALS — Ht 71.0 in | Wt 212.0 lb

## 2023-11-13 DIAGNOSIS — Z860100 Personal history of colon polyps, unspecified: Secondary | ICD-10-CM

## 2023-11-13 MED ORDER — NA SULFATE-K SULFATE-MG SULF 17.5-3.13-1.6 GM/177ML PO SOLN
1.0000 | Freq: Once | ORAL | 0 refills | Status: AC
Start: 2023-11-13 — End: 2023-11-13

## 2023-11-13 NOTE — Progress Notes (Signed)
Pre visit completed over telephone.  Instructions forwarded through MyChart.    No egg or soy allergy known to patient  No issues known to pt with past sedation with any surgeries or procedures Patient denies ever being told they had issues or difficulty with intubation  No FH of Malignant Hyperthermia Pt is not on diet pills Pt is not on  home 02  Pt is not on blood thinners  Pt denies issues with constipation  No A fib or A flutter Have any cardiac testing pending-NO Pt instructed to use Singlecare.[email or web address] or GoodRx for a price reduction on prep   

## 2023-11-13 NOTE — Telephone Encounter (Signed)
 Attempted to reach patient x 2 for telephone PV. Requested patient return call to reschedule P to avoid cancellation of colonoscopy.

## 2023-11-13 NOTE — Telephone Encounter (Signed)
 Reached patient and PV completed.

## 2023-11-20 ENCOUNTER — Encounter: Payer: Self-pay | Admitting: Gastroenterology

## 2023-11-25 ENCOUNTER — Ambulatory Visit: Payer: BC Managed Care – PPO | Admitting: Gastroenterology

## 2023-11-25 ENCOUNTER — Encounter: Payer: Self-pay | Admitting: Gastroenterology

## 2023-11-25 VITALS — BP 105/60 | HR 72 | Temp 98.2°F | Resp 13 | Ht 71.0 in | Wt 212.0 lb

## 2023-11-25 DIAGNOSIS — K5730 Diverticulosis of large intestine without perforation or abscess without bleeding: Secondary | ICD-10-CM | POA: Diagnosis not present

## 2023-11-25 DIAGNOSIS — Z860100 Personal history of colon polyps, unspecified: Secondary | ICD-10-CM

## 2023-11-25 DIAGNOSIS — K641 Second degree hemorrhoids: Secondary | ICD-10-CM | POA: Diagnosis not present

## 2023-11-25 DIAGNOSIS — Z860101 Personal history of adenomatous and serrated colon polyps: Secondary | ICD-10-CM | POA: Diagnosis not present

## 2023-11-25 DIAGNOSIS — Z1211 Encounter for screening for malignant neoplasm of colon: Secondary | ICD-10-CM | POA: Diagnosis present

## 2023-11-25 MED ORDER — SODIUM CHLORIDE 0.9 % IV SOLN: 500.0000 mL | Freq: Once | INTRAVENOUS | Status: DC

## 2023-11-25 NOTE — Op Note (Signed)
 [facility] Patient Name: Joseph Booth Procedure Date: 11/25/2023 9:02 AM MRN: [id number] Endoscopist: Yong Henle , MD, [id number] Age: 63 Referring MD:  Date of Birth: 1961-06-22 Gender: Male Account #: [id number] Procedure:                Colonoscopy Indications:              High risk colon cancer surveillance: Personal                            history of adenoma (10 mm or greater in size) Medicines:                Monitored Anesthesia Care Procedure:                Pre-Anesthesia Assessment:                           - Prior to the procedure, a History and Physical                            was performed, and patient medications and                            allergies were reviewed. The patient's tolerance of                            previous anesthesia was also reviewed. The risks                            and benefits of the procedure and the sedation                            options and risks were discussed with the patient.                            All questions were answered, and informed consent                            was obtained. Prior Anticoagulants: The patient has                            taken no anticoagulant or antiplatelet agents. ASA                            Grade Assessment: II - A patient with mild systemic                            disease. After reviewing the risks and benefits,                            the patient was deemed in satisfactory condition to                            undergo the procedure.  After obtaining informed consent, the colonoscope                            was passed under direct vision. Throughout the                            procedure, the patient's blood pressure, pulse, and                            oxygen saturations were monitored continuously. The                            Olympus CF-HQ190L ([id number]) Colonoscope was                            introduced through  the anus and advanced to the the                            cecum, identified by appendiceal orifice and                            ileocecal valve. The colonoscopy was performed                            without difficulty. The patient tolerated the                            procedure. The quality of the bowel preparation was                            adequate. The ileocecal valve, appendiceal orifice,                            and rectum were photographed. Scope In: 9:08:42 AM Scope Out: 9:25:04 AM Scope Withdrawal Time: 0 hours 12 minutes 46 seconds  Total Procedure Duration: 0 hours 16 minutes 22 seconds  Findings:                 The digital rectal exam findings include                            hemorrhoids. Pertinent negatives include no                            palpable rectal lesions.                           A large amount of semi-liquid stool was found in                            the entire colon, interfering with visualization.                            Lavage of the area was performed using copious  amounts, resulting in clearance with adequate                            visualization.                           Many medium-mouthed and small-mouthed diverticula                            were found in the entire colon.                           Normal mucosa was found in the entire colon.                           Non-bleeding non-thrombosed external and internal                            hemorrhoids were found during retroflexion, during                            perianal exam and during digital exam. The                            hemorrhoids were Grade II (internal hemorrhoids                            that prolapse but reduce spontaneously). Complications:            No immediate complications. Estimated Blood Loss:     Estimated blood loss: none. Impression:               - Hemorrhoids found on digital rectal exam.                            - Stool in the entire examined colon - lavaged with                            adequate visualization.                           - Diverticulosis in the entire examined colon.                           - Normal mucosa in the entire examined colon.                           - Non-bleeding non-thrombosed external and internal                            hemorrhoids. Recommendation:           - The patient will be observed post-procedure,                            until all discharge criteria are met.                           -  Discharge patient to home.                           - Patient has a contact number available for                            emergencies. The signs and symptoms of potential                            delayed complications were discussed with the                            patient. Return to normal activities tomorrow.                            Written discharge instructions were provided to the                            patient.                           - High fiber diet.                           - Use FiberCon 1-2 tablets PO daily.                           - Continue present medications.                           - Repeat colonoscopy in 5 years for surveillance                            due to history of previous advanced adenomas                            (recommend 1 week of MiraLAX and every other day                            Dulcolax prior to formal 1 day preparation).                           - The findings and recommendations were discussed                            with the patient.                           - The findings and recommendations were discussed                            with the patient's family. Yong Henle, MD 11/25/2023 9:31:10 AM

## 2023-11-25 NOTE — Patient Instructions (Signed)
-   High fiber diet. - Use FiberCon 1-2 tablets PO daily. - Continue present medications. - Patient given educational handouts related to procedure.   YOU HAD AN ENDOSCOPIC PROCEDURE TODAY AT THE [facility]:   Refer to the procedure report that was given to you for any specific questions about what was found during the examination.  If the procedure report does not answer your questions, please call your gastroenterologist to clarify.  If you requested that your care partner not be given the details of your procedure findings, then the procedure report has been included in a sealed envelope for you to review at your convenience later.  YOU SHOULD EXPECT: Some feelings of bloating in the abdomen. Passage of more gas than usual.  Walking can help get rid of the air that was put into your GI tract during the procedure and reduce the bloating. If you had a lower endoscopy (such as a colonoscopy or flexible sigmoidoscopy) you may notice spotting of blood in your stool or on the toilet paper. If you underwent a bowel prep for your procedure, you may not have a normal bowel movement for a few days.  Please Note:  You might notice some irritation and congestion in your nose or some drainage.  This is from the oxygen used during your procedure.  There is no need for concern and it should clear up in a day or so.  SYMPTOMS TO REPORT IMMEDIATELY:  Following lower endoscopy (colonoscopy or flexible sigmoidoscopy):  Excessive amounts of blood in the stool  Significant tenderness or worsening of abdominal pains  Swelling of the abdomen that is new, acute  Fever of 100F or higher  For urgent or emergent issues, a gastroenterologist can be reached at any hour by calling [phone]. Do not use MyChart messaging for urgent concerns.    DIET:  We do recommend a small meal at first, but then you may proceed to your regular diet.  Drink plenty of fluids but you should avoid alcoholic beverages  for 24 hours.  ACTIVITY:  You should plan to take it easy for the rest of today and you should NOT DRIVE or use heavy machinery until tomorrow (because of the sedation medicines used during the test).    FOLLOW UP: Our staff will call the number listed on your records the next business day following your procedure.  We will call around 7:15- 8:00 am to check on you and address any questions or concerns that you may have regarding the information given to you following your procedure. If we do not reach you, we will leave a message.     If any biopsies were taken you will be contacted by phone or by letter within the next 1-3 weeks.  Please call us  at [phone] if you have not heard about the biopsies in 3 weeks.    SIGNATURES/CONFIDENTIALITY: You and/or your care partner have signed paperwork which will be entered into your electronic medical record.  These signatures attest to the fact that that the information above on your After Visit Summary has been reviewed and is understood.  Full responsibility of the confidentiality of this discharge information lies with you and/or your care-partner.

## 2023-11-25 NOTE — Progress Notes (Signed)
 Vss nad trans to pacu

## 2023-11-25 NOTE — Progress Notes (Signed)
 GASTROENTEROLOGY PROCEDURE H&P NOTE   Primary Care Physician: Kent Pear, MD  HPI: Joseph Booth is a 63 y.o. male who presents for Colonoscopy for surveillance with history of previous advanced adenomas.  Past Medical History:  Diagnosis Date   Basal cell carcinoma 02/20/2020   right lat forehead   Bladder stones    Chronic gastritis 02/2018   History of gastric ulcer    History of kidney stones    History of stress test 02-09-2018   dr Bary Likes   normal stress echo, normal RVSF, mild TR   Hypertension    cardiologist-  dr Bary Likes [facility] [place])   Mixed hyperlipidemia    Seasonal allergies    Past Surgical History:  Procedure Laterality Date   CATARACT EXTRACTION W/PHACO Left 07/22/2023   Procedure: CATARACT EXTRACTION PHACO AND INTRAOCULAR LENS PLACEMENT (IOC) LEFT DIABETIC  10.42 00:41.7;  Surgeon: Annell Kidney, MD;  Location: [facility];  Service: Ophthalmology;  Laterality: Left;   CLOSED MANIPULATION SHOULDER WITH STERIOD INJECTION Left 09/20/2019   Procedure: CLOSED MANIPULATION SHOULDER WITH STEROID INJECTION;  Surgeon: Elner Hahn, MD;  Location: [facility];  Service: Orthopedics;  Laterality: Left;   COLONOSCOPY  last one 2016   CYSTOSCOPY WITH LITHOLAPAXY Right 07/14/2018   Procedure: CYSTOSCOPY WITH RIGHT RETROGRADE PYELOGRAM, RIGHT URETEROSCOPY WITH LASER LITHOTRIPSY AND STONE BASKETTING AND STENT PLACEMENT;  Surgeon: Adelbert Homans, MD;  Location: [facility];  Service: Urology;  Laterality: Right;   EYE SURGERY     lasik   SHOULDER ARTHROSCOPY WITH OPEN ROTATOR CUFF REPAIR Left 04/21/2019   Procedure: SHOULDER ARTHROSCOPY WITH OPEN ROTATOR CUFF REPAIR;  Surgeon: Elner Hahn, MD;  Location: [facility];  Service: Orthopedics;  Laterality: Left;   TONSILLECTOMY  1969   TYMPANOPLASTY Right 2000   UPPER GASTROINTESTINAL ENDOSCOPY  last one 02-24-2018   WRIST GANGLION EXCISION Left ?   Current Outpatient  Medications  Medication Sig Dispense Refill   Blood Glucose Monitoring Suppl DEVI 1 each by Does not apply route daily. May substitute to any manufacturer covered by patient's insurance. 1 each 0   Glucose Blood (BLOOD GLUCOSE TEST STRIPS) STRP 1 each by In Vitro route daily. May substitute to any manufacturer covered by patient's insurance. 100 strip 3   Lancet Device MISC 1 each by Does not apply route daily. May substitute to any manufacturer covered by patient's insurance. 1 each 3   Lancets Misc. MISC 1 each by Does not apply route daily. May substitute to any manufacturer covered by patient's insurance. 100 each 3   losartan  (COZAAR ) 50 MG tablet TAKE 1 TABLET BY MOUTH EVERY DAY 90 tablet 0   cetirizine  (ZYRTEC ) 10 MG tablet Take 1 tablet (10 mg total) by mouth daily as needed for allergies. 90 tablet 3   meclizine  (ANTIVERT ) 12.5 MG tablet Take 1 tablet (12.5 mg total) by mouth 3 (three) times daily as needed for dizziness. 30 tablet 0   triamcinolone  cream (KENALOG ) 0.1 % Apply 1 Application topically 2 (two) times daily. 30 g 0   Current Facility-Administered Medications  Medication Dose Route Frequency Provider Last Rate Last Admin   0.9 %  sodium chloride  infusion  500 mL Intravenous Once Mansouraty, Antwion Carpenter Jr., MD        Current Outpatient Medications:    Blood Glucose Monitoring Suppl DEVI, 1 each by Does not apply route daily. May substitute to any manufacturer covered by patient's insurance., Disp: 1 each, Rfl: 0   Glucose  Blood (BLOOD GLUCOSE TEST STRIPS) STRP, 1 each by In Vitro route daily. May substitute to any manufacturer covered by patient's insurance., Disp: 100 strip, Rfl: 3   Lancet Device MISC, 1 each by Does not apply route daily. May substitute to any manufacturer covered by patient's insurance., Disp: 1 each, Rfl: 3   Lancets Misc. MISC, 1 each by Does not apply route daily. May substitute to any manufacturer covered by patient's insurance., Disp: 100 each, Rfl: 3    losartan  (COZAAR ) 50 MG tablet, TAKE 1 TABLET BY MOUTH EVERY DAY, Disp: 90 tablet, Rfl: 0   cetirizine  (ZYRTEC ) 10 MG tablet, Take 1 tablet (10 mg total) by mouth daily as needed for allergies., Disp: 90 tablet, Rfl: 3   meclizine  (ANTIVERT ) 12.5 MG tablet, Take 1 tablet (12.5 mg total) by mouth 3 (three) times daily as needed for dizziness., Disp: 30 tablet, Rfl: 0   triamcinolone  cream (KENALOG ) 0.1 %, Apply 1 Application topically 2 (two) times daily., Disp: 30 g, Rfl: 0  Current Facility-Administered Medications:    0.9 %  sodium chloride  infusion, 500 mL, Intravenous, Once, Mansouraty, Albino Alu., MD Allergies  Allergen Reactions   Pollen Extract Other (See Comments)    Seasonal allergies   Family History  Problem Relation Age of Onset   Alcohol abuse Mother    Mental illness Sister    Squamous cell carcinoma Father    Colon cancer Neg Hx    Colon polyps Neg Hx    Esophageal cancer Neg Hx    Stomach cancer Neg Hx    Rectal cancer Neg Hx    Social History   Socioeconomic History   Marital status: Married    Spouse name: Not on file   Number of children: 2   Years of education: 16   Highest education level: Some college, no degree  Occupational History   Occupation: Stage manager  Tobacco Use   Smoking status: Never   Smokeless tobacco: Never  Vaping Use   Vaping status: Never Used  Substance and Sexual Activity   Alcohol use: Yes    Comment: occ   Drug use: No   Sexual activity: Not on file  Other Topics Concern   Not on file  Social History Narrative   Not on file   Social Drivers of Health   Financial Resource Strain: Low Risk  (10/15/2023)   Overall Financial Resource Strain (CARDIA)    Difficulty of Paying Living Expenses: Not hard at all  Food Insecurity: No Food Insecurity (10/15/2023)   Hunger Vital Sign    Worried About Running Out of Food in the Last Year: Never true    Ran Out of Food in the Last Year: Never true  Transportation Needs: No Transportation  Needs (10/15/2023)   PRAPARE - Administrator, Civil Service (Medical): No    Lack of Transportation (Non-Medical): No  Physical Activity: Insufficiently Active (10/15/2023)   Exercise Vital Sign    Days of Exercise per Week: 1 day    Minutes of Exercise per Session: 20 min  Stress: No Stress Concern Present (10/15/2023)   [employer] of Occupational Health - Occupational Stress Questionnaire    Feeling of Stress : Not at all  Social Connections: Unknown (10/15/2023)   Social Connection and Isolation Panel [NHANES]    Frequency of Communication with Friends and Family: Patient declined    Frequency of Social Gatherings with Friends and Family: Patient declined    Attends Religious Services: Patient declined  Active Member of Clubs or Organizations: Yes    Attends Banker Meetings: Patient declined    Marital Status: Married  Catering manager Violence: Not on file    Physical Exam: Today's Vitals   11/25/23 0805  BP: 107/64  Pulse: 78  Temp: 98.2 F (36.8 C)  SpO2: 92%  Weight: 212 lb (96.2 kg)  Height: 5\' 11"  (1.803 m)   Body mass index is 29.57 kg/m. GEN: NAD EYE: Sclerae anicteric ENT: MMM CV: Non-tachycardic GI: Soft, NT/ND NEURO:  Alert & Oriented x 3  Lab Results: No results for input(s): "WBC", "HGB", "HCT", "PLT" in the last 72 hours. BMET No results for input(s): "NA", "K", "CL", "CO2", "GLUCOSE", "BUN", "CREATININE", "CALCIUM " in the last 72 hours. LFT No results for input(s): "PROT", "ALBUMIN", "AST", "ALT", "ALKPHOS", "BILITOT", "BILIDIR", "IBILI" in the last 72 hours. PT/INR No results for input(s): "LABPROT", "INR" in the last 72 hours.   Impression / Plan: This is a 63 y.o.male  who presents for Colonoscopy for surveillance with history of previous advanced adenomas.  The risks and benefits of endoscopic evaluation/treatment were discussed with the patient and/or family; these include but are not limited to the risk of  perforation, infection, bleeding, missed lesions, lack of diagnosis, severe illness requiring hospitalization, as well as anesthesia and sedation related illnesses.  The patient's history has been reviewed, patient examined, no change in status, and deemed stable for procedure.  The patient and/or family is agreeable to proceed.    Yong Henle, MD [facility] Advanced Endoscopy Office # [phone]

## 2023-11-26 ENCOUNTER — Telehealth: Payer: Self-pay | Admitting: *Deleted

## 2023-11-26 NOTE — Telephone Encounter (Signed)
  Follow up Call-     11/25/2023    8:05 AM  Call back number  Post procedure Call Back phone  # [phone]  Permission to leave phone message Yes     Patient questions:   Message left to call us if necessary.

## 2024-02-01 ENCOUNTER — Telehealth: Payer: Self-pay | Admitting: Family Medicine

## 2024-02-01 NOTE — Telephone Encounter (Signed)
 Lm that TOC was changed to a 44m folllow up. Patient will neet to schedule a TOC with one of the following providers. Dr Clent Ridges is leaving the practice and your New Patient or Transfer of Care appointment needs to be rescheduled with another provider. Please call the office to schedule a Transfer of Care to either Dr Charlann Lange, Darleen Crocker or Kara Dies, NP.  E2C2 please schedule a TOC 6 months form June's follow up.

## 2024-03-31 ENCOUNTER — Encounter: Payer: Self-pay | Admitting: Family Medicine

## 2024-04-01 ENCOUNTER — Other Ambulatory Visit: Payer: Self-pay | Admitting: Family Medicine

## 2024-04-01 DIAGNOSIS — I10 Essential (primary) hypertension: Secondary | ICD-10-CM

## 2024-04-01 MED ORDER — LOSARTAN POTASSIUM 50 MG PO TABS
50.0000 mg | ORAL_TABLET | Freq: Every day | ORAL | 0 refills | Status: DC
Start: 2024-04-01 — End: 2024-04-18

## 2024-04-17 DIAGNOSIS — E118 Type 2 diabetes mellitus with unspecified complications: Secondary | ICD-10-CM

## 2024-04-17 HISTORY — DX: Type 2 diabetes mellitus with unspecified complications: E11.8

## 2024-04-17 NOTE — Patient Instructions (Incomplete)
 It was a pleasure meeting you today. Thank you for allowing me to take part in your health care.  Our goals for today as we discussed include:  A1c  We will get some labs today.  If they are abnormal or we need to do something about them, I will call you.  If they are normal, I will send you a message on MyChart (if it is active) or a letter in the mail.  If you don't hear from us  in 2 weeks, please call the office at the number below.   Annual eye exam due. Please schedule eye exam.   This is a list of the screening recommended for you and due dates:  Health Maintenance  Topic Date Due   Eye exam for diabetics  Never done   Yearly kidney health urinalysis for diabetes  Never done   DTaP/Tdap/Td vaccine (1 - Tdap) Never done   Pneumococcal Vaccination (1 of 2 - PCV) Never done   Zoster (Shingles) Vaccine (1 of 2) Never done   COVID-19 Vaccine (2 - Janssen risk series) 04/26/2020   Hemoglobin A1C  04/18/2024   Flu Shot  06/10/2024   Yearly kidney function blood test for diabetes  10/18/2024   Complete foot exam   10/18/2024   Colon Cancer Screening  11/24/2028   HPV Vaccine  Aged Out   Meningitis B Vaccine  Aged Out      If you have any questions or concerns, please do not hesitate to call the office at [phone].  I look forward to our next visit and until then take care and stay safe.  Regards,   Valli Gaw, MD   [facility]

## 2024-04-18 ENCOUNTER — Ambulatory Visit: Payer: BC Managed Care – PPO | Admitting: Family Medicine

## 2024-04-18 ENCOUNTER — Ambulatory Visit: Payer: Self-pay | Admitting: Family Medicine

## 2024-04-18 ENCOUNTER — Encounter: Payer: Self-pay | Admitting: Family Medicine

## 2024-04-18 VITALS — BP 116/64 | HR 71 | Temp 98.5°F | Resp 20 | Ht 71.0 in | Wt 214.2 lb

## 2024-04-18 DIAGNOSIS — E1169 Type 2 diabetes mellitus with other specified complication: Secondary | ICD-10-CM

## 2024-04-18 DIAGNOSIS — E1159 Type 2 diabetes mellitus with other circulatory complications: Secondary | ICD-10-CM

## 2024-04-18 DIAGNOSIS — E785 Hyperlipidemia, unspecified: Secondary | ICD-10-CM

## 2024-04-18 DIAGNOSIS — E118 Type 2 diabetes mellitus with unspecified complications: Secondary | ICD-10-CM | POA: Diagnosis not present

## 2024-04-18 DIAGNOSIS — I152 Hypertension secondary to endocrine disorders: Secondary | ICD-10-CM

## 2024-04-18 LAB — POCT GLYCOSYLATED HEMOGLOBIN (HGB A1C): Hemoglobin A1C: 4.6 % (ref 4.0–5.6)

## 2024-04-18 MED ORDER — LOSARTAN POTASSIUM 25 MG PO TABS: 25.0000 mg | ORAL_TABLET | Freq: Every day | ORAL | Status: DC

## 2024-04-18 NOTE — Assessment & Plan Note (Signed)
 Dizziness upon standing suggests overmedication due to recent weight loss. Blood pressure 116/64 mmHg. - Decrease losartan  to 25 mg daily. - Monitor blood pressure to ensure it remains below 140/90 mmHg. - Contact provider if dizziness persists or blood pressure increases. - Check Cmet

## 2024-04-18 NOTE — Assessment & Plan Note (Addendum)
 Excellent glycemic control with lifestyle modifications. A1c decreased to 4.6%. No medication needed currently. Discussed potential future need for medication if glycemic control worsens. - Continue dietary modifications and regular blood glucose monitoring. - Initiate exercise regimen. - On ARB - Previously declined statin - Check UACR

## 2024-04-18 NOTE — Progress Notes (Signed)
 SUBJECTIVE:   Chief Complaint  Patient presents with   Diabetes    6 month follow up   HPI Presents to clinic for follow up chronic disease management  Discussed the use of AI scribe software for clinical note transcription with the patient, who gave verbal consent to proceed.  History of Present Illness Joseph Booth is a 63 year old male with diabetes who presents for a six-month follow-up.  He was initially diagnosed with diabetes after a hospital visit where high sugar levels were detected in his urine. Since then, he has made significant dietary changes, cutting out sugary drinks and sweets, resulting in a weight loss from 240 lbs to between 205 and 210 lbs. His A1c has decreased from a high of 8% to 4.6%, and his three-month average blood glucose is 113 mg/dL. He tests his blood sugar every other day and maintains a diet low in sugars and carbohydrates.  He is currently on losartan  50 mg for blood pressure management and experiences dizziness upon standing, which he associates with his recent weight loss. His most recent blood pressure measurement was 116/64 mmHg.  He has not been on any cholesterol medication despite previous discussions. He had a prescription for a statin but did not start it.  He underwent a colonoscopy in January 2025, which was clear, although he has a history of polyps. He is unsure if his follow-up interval will be five or ten years.  He does not smoke and drinks alcohol only occasionally, socially. 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     PERTINENT PMH / PSH: As above  OBJECTIVE:  BP 116/64   Pulse 71   Temp 98.5 F (36.9 C)   Resp 20   Ht 5\' 11"  (1.803 m)   Wt 214 lb 4 oz (97.2 kg)   SpO2 94%   BMI 29.88 kg/m    Physical Exam Vitals reviewed.  HENT:     Head: Normocephalic.     Right Ear: Tympanic membrane, ear canal and external ear normal.     Left Ear: Tympanic membrane, ear canal and external ear normal.      Nose: Nose normal.     Mouth/Throat:     Mouth: Mucous membranes are moist.  Eyes:     Conjunctiva/sclera: Conjunctivae normal.     Pupils: Pupils are equal, round, and reactive to light.  Neck:     Thyroid: No thyromegaly or thyroid tenderness.     Vascular: No carotid bruit.  Cardiovascular:     Rate and Rhythm: Normal rate and regular rhythm.     Pulses: Normal pulses.     Heart sounds: Normal heart sounds.  Pulmonary:     Effort: Pulmonary effort is normal.     Breath sounds: Normal breath sounds.  Abdominal:     General: Abdomen is flat. Bowel sounds are normal.     Palpations: Abdomen is soft.  Musculoskeletal:        General: Normal range of motion.     Cervical back: Normal range of motion and neck supple.     Right lower leg: No edema.     Left lower leg: No edema.  Lymphadenopathy:     Cervical: No cervical adenopathy.  Neurological:     General: No focal deficit present.     Mental Status: He is alert and oriented to person, place, and time. Mental status is at baseline.  Psychiatric:  Mood and Affect: Mood normal.        Behavior: Behavior normal.        Thought Content: Thought content normal.        Judgment: Judgment normal.           04/18/2024   11:22 AM 10/19/2023    8:54 AM 07/15/2023    8:35 AM 05/04/2023    8:43 AM 03/07/2022   11:25 AM  Depression screen PHQ 2/9  Decreased Interest 0 0 0 0 0  Down, Depressed, Hopeless 0 0 0 0 0  PHQ - 2 Score 0 0 0 0 0  Altered sleeping 0 0 0 0   Tired, decreased energy 0 0 0 0   Change in appetite 0 0 0 0   Feeling bad or failure about yourself  0 0 0 0   Trouble concentrating 0 0 0 0   Moving slowly or fidgety/restless 0 0 0 0   Suicidal thoughts 0 0 0 0   PHQ-9 Score 0 0 0 0   Difficult doing work/chores Not difficult at all Not difficult at all Not difficult at all Not difficult at all       04/18/2024   11:22 AM 10/19/2023    8:54 AM 07/15/2023    8:35 AM 05/04/2023    8:43 AM  GAD 7 : Generalized  Anxiety Score  Nervous, Anxious, on Edge 0 0 0 0  Control/stop worrying 0 0 0 0  Worry too much - different things 0 0 0 0  Trouble relaxing 0 0 0 0  Restless 0 0 0 0  Easily annoyed or irritable 0 0 0 0  Afraid - awful might happen 0 0 0 0  Total GAD 7 Score 0 0 0 0  Anxiety Difficulty Not difficult at all Not difficult at all Not difficult at all Not difficult at all    ASSESSMENT/PLAN:  Type 2 diabetes with complication [facility]) Assessment & Plan: Excellent glycemic control with lifestyle modifications. A1c decreased to 4.6%. No medication needed currently. Discussed potential future need for medication if glycemic control worsens. - Continue dietary modifications and regular blood glucose monitoring. - Initiate exercise regimen. - On ARB - Previously declined statin - Check UACR  Orders: -     Microalbumin / creatinine urine ratio -     POCT glycosylated hemoglobin (Hb A1C)  Hypertension associated with diabetes (HCC) Assessment & Plan: Dizziness upon standing suggests overmedication due to recent weight loss. Blood pressure 116/64 mmHg. - Decrease losartan  to 25 mg daily. - Monitor blood pressure to ensure it remains below 140/90 mmHg. - Contact provider if dizziness persists or blood pressure increases. - Check Cmet   Orders: -     Comprehensive metabolic panel with GFR -     Losartan  Potassium; Take 1 tablet (25 mg total) by mouth daily.  Hyperlipidemia associated with type 2 diabetes mellitus (HCC) Assessment & Plan: Increased cardiovascular risk. LDL is not at goal <70 mg/dL. Benefits of statin therapy explained. - Check lipid panel today - Recommend statin if not at goal - Educate on dietary modifications to manage cholesterol levels.  Orders: -     Lipid panel    PDMP reviewed  Return in about 7 weeks (around 06/03/2024) for PCP.  Valli Gaw, MD

## 2024-04-18 NOTE — Assessment & Plan Note (Signed)
 Increased cardiovascular risk. LDL is not at goal <70 mg/dL. Benefits of statin therapy explained. - Check lipid panel today - Recommend statin if not at goal - Educate on dietary modifications to manage cholesterol levels.

## 2024-04-19 LAB — COMPREHENSIVE METABOLIC PANEL WITH GFR
ALT: 13 IU/L (ref 0–44)
AST: 16 IU/L (ref 0–40)
Albumin: 4.9 g/dL (ref 3.8–4.9)
Alkaline Phosphatase: 59 IU/L (ref 44–121)
BUN/Creatinine Ratio: 18 (ref 10–24)
BUN: 20 mg/dL (ref 8–27)
Bilirubin Total: 0.6 mg/dL (ref 0.0–1.2)
CO2: 23 mmol/L (ref 20–29)
Calcium: 9.6 mg/dL (ref 8.6–10.2)
Chloride: 104 mmol/L (ref 96–106)
Creatinine, Ser: 1.09 mg/dL (ref 0.76–1.27)
Globulin, Total: 2.1 g/dL (ref 1.5–4.5)
Glucose: 112 mg/dL — ABNORMAL HIGH (ref 70–99)
Potassium: 5 mmol/L (ref 3.5–5.2)
Sodium: 142 mmol/L (ref 134–144)
Total Protein: 7 g/dL (ref 6.0–8.5)
eGFR: 78 mL/min/{1.73_m2} (ref >59)

## 2024-04-19 LAB — LIPID PANEL
Chol/HDL Ratio: 5.1 ratio — ABNORMAL HIGH (ref 0.0–5.0)
Cholesterol, Total: 185 mg/dL (ref 100–199)
HDL: 36 mg/dL — ABNORMAL LOW (ref >39)
LDL Chol Calc (NIH): 132 mg/dL — ABNORMAL HIGH (ref 0–99)
Triglycerides: 93 mg/dL (ref 0–149)
VLDL Cholesterol Cal: 17 mg/dL (ref 5–40)

## 2024-04-19 LAB — MICROALBUMIN / CREATININE URINE RATIO
Creatinine, Urine: 168.6 mg/dL
Microalb/Creat Ratio: 6 mg/g{creat} (ref 0–29)
Microalbumin, Urine: 9.9 ug/mL

## 2024-06-03 ENCOUNTER — Ambulatory Visit

## 2024-06-03 VITALS — BP 92/72 | HR 79 | Temp 98.6°F | Ht 71.0 in | Wt 212.6 lb

## 2024-06-03 DIAGNOSIS — R42 Dizziness and giddiness: Secondary | ICD-10-CM

## 2024-06-03 DIAGNOSIS — I10 Essential (primary) hypertension: Secondary | ICD-10-CM | POA: Diagnosis not present

## 2024-06-03 DIAGNOSIS — E782 Mixed hyperlipidemia: Secondary | ICD-10-CM

## 2024-06-03 DIAGNOSIS — J309 Allergic rhinitis, unspecified: Secondary | ICD-10-CM

## 2024-06-03 DIAGNOSIS — E1159 Type 2 diabetes mellitus with other circulatory complications: Secondary | ICD-10-CM

## 2024-06-03 DIAGNOSIS — K5900 Constipation, unspecified: Secondary | ICD-10-CM | POA: Insufficient documentation

## 2024-06-03 DIAGNOSIS — E785 Hyperlipidemia, unspecified: Secondary | ICD-10-CM

## 2024-06-03 DIAGNOSIS — M7582 Other shoulder lesions, left shoulder: Secondary | ICD-10-CM

## 2024-06-03 DIAGNOSIS — Z0000 Encounter for general adult medical examination without abnormal findings: Secondary | ICD-10-CM

## 2024-06-03 DIAGNOSIS — J301 Allergic rhinitis due to pollen: Secondary | ICD-10-CM

## 2024-06-03 DIAGNOSIS — E119 Type 2 diabetes mellitus without complications: Secondary | ICD-10-CM

## 2024-06-03 MED ORDER — ROSUVASTATIN CALCIUM 10 MG PO TABS: 10.0000 mg | ORAL_TABLET | Freq: Every day | ORAL | 0 refills | Status: DC

## 2024-06-03 MED ORDER — CETIRIZINE HCL 10 MG PO TABS: 10.0000 mg | ORAL_TABLET | Freq: Every day | ORAL | 3 refills | Status: AC | PRN

## 2024-06-03 MED ORDER — TELMISARTAN 20 MG PO TABS: 20.0000 mg | ORAL_TABLET | Freq: Every day | ORAL | 0 refills | Status: DC

## 2024-06-03 NOTE — Progress Notes (Addendum)
 Established Patient Office Visit TOC from Dr. Otelia. Maribeth    Subjective  Patient ID: Joseph Booth, male    DOB: December 12, 1960  Age: 63 y.o. 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  HPI Discussed the use of AI scribe software for clinical note transcription with the patient, who gave verbal consent to proceed.  History of Present Illness Joseph Booth is a 62 year old male presenting for transfer of care.   He has hypertension and hyperlipidemia, with recent labs showing low HDL and elevated LDL cholesterol. He is not on medication for elevated LDL cholesterol.  He takes 50 mg of losartan  daily for hypertension. He experiences occasional dizziness when standing quickly, especially after kneeling or squatting. He has lost weight, from 240 to 207 pounds, and questions the need for blood pressure medication given his lifestyle changes.  He has a history of basal cell carcinoma and lipoma removal, with no recent dermatological issues. He practices sun protection by wearing a wide-brimmed hat and long pants.  He manages his blood sugar through diet and lifestyle changes without medication. He monitors his blood sugar at home, checking it approximately 11 out of 14 days,  and has successfully lowered his levels.  He experiences seasonal allergies and takes Zyrtec  as needed, typically starting in September and continuing through October.  He has a history of vertigo, which he attributes to congestion, particularly on the right side. He takes meclizine  as needed and has not undergone physical therapy for vertigo.  He occasionally experiences constipation, which he associates with dietary changes. He acknowledges not consuming enough fiber and is working on increasing his water  intake.  He works in [employer] at a [employer] in [place], which he describes as a non-stressful job. He does not drink alcohol regularly, only occasionally during social events.   ROS As per HPI    Objective:     BP 92/72 (BP Location: Right Arm, Patient Position: Sitting, Cuff Size: Normal)   Pulse 79   Temp 98.6 F (37 C) (Oral)   Ht 5' 11 (1.803 m)   Wt 212 lb 9.6 oz (96.4 kg)   SpO2 90%   BMI 29.65 kg/m      06/03/2024   10:05 AM 04/18/2024   11:22 AM 10/19/2023    8:54 AM  Depression screen PHQ 2/9  Decreased Interest 0 0 0  Down, Depressed, Hopeless 0 0 0  PHQ - 2 Score 0 0 0  Altered sleeping 0 0 0  Tired, decreased energy 0 0 0  Change in appetite 0 0 0  Feeling bad or failure about yourself  0 0 0  Trouble concentrating 0 0 0  Moving slowly or fidgety/restless 0 0 0  Suicidal thoughts 0 0 0  PHQ-9 Score 0 0 0  Difficult doing work/chores Not difficult at all Not difficult at all Not difficult at all  06/03/2024   10:05 AM 04/18/2024   11:22 AM 10/19/2023    8:54 AM 07/15/2023    8:35 AM  GAD 7 : Generalized Anxiety Score  Nervous, Anxious, on Edge 0 0 0 0  Control/stop worrying 0 0 0 0  Worry too much - different things 0 0 0 0  Trouble relaxing 0 0 0 0  Restless 0 0 0 0  Easily annoyed or irritable 0 0 0 0  Afraid - awful might happen 0 0 0 0  Total GAD 7 Score 0 0 0 0  Anxiety Difficulty Not difficult at all Not difficult at all Not difficult at all  Not difficult at all      06/03/2024   10:05 AM 04/18/2024   11:22 AM 10/19/2023    8:54 AM  Depression screen PHQ 2/9  Decreased Interest 0 0 0  Down, Depressed, Hopeless 0 0 0  PHQ - 2 Score 0 0 0  Altered sleeping 0 0 0  Tired, decreased energy 0 0 0  Change in appetite 0 0 0  Feeling bad or failure about yourself  0 0 0  Trouble concentrating 0 0 0  Moving slowly or fidgety/restless 0 0 0  Suicidal thoughts 0 0 0  PHQ-9 Score 0 0 0  Difficult doing work/chores Not difficult at all Not difficult at all Not difficult at all      06/03/2024   10:05 AM 04/18/2024   11:22 AM 10/19/2023    8:54 AM 07/15/2023    8:35 AM  GAD 7 : Generalized Anxiety Score  Nervous, Anxious, on Edge 0 0 0 0  Control/stop worrying 0 0 0 0  Worry too much - different things 0 0 0 0  Trouble relaxing 0 0 0 0  Restless 0 0 0 0  Easily annoyed or irritable 0 0 0 0  Afraid - awful might happen 0 0 0 0  Total GAD 7 Score 0 0 0 0  Anxiety Difficulty Not difficult at all Not difficult at all Not difficult at all Not difficult at all   SDOH Screenings   Food Insecurity: Patient Declined (05/30/2024)  Housing: Unknown (05/30/2024)  Transportation Needs: No Transportation Needs (05/30/2024)  Alcohol Screen: Low Risk  (05/30/2024)  Depression (PHQ2-9): Low Risk  (06/03/2024)  Financial Resource Strain: Patient Declined (05/30/2024)  Physical Activity: Unknown (05/30/2024)  Recent Concern: Physical Activity - Insufficiently Active (04/14/2024)  Social Connections: Unknown (05/30/2024)  Stress: No Stress Concern Present (05/30/2024)  Tobacco Use: Low Risk  (06/03/2024)     Physical Exam Constitutional:      Appearance: Normal appearance.  HENT:     Head: Normocephalic and atraumatic.     Mouth/Throat:     Mouth: Mucous membranes are moist.  Neck:     Thyroid: No thyroid mass or thyroid tenderness.  Cardiovascular:     Rate and Rhythm: Normal rate and regular rhythm.  Pulmonary:     Effort: Pulmonary effort is  normal.     Breath sounds: Normal breath sounds.  Abdominal:     General: Bowel sounds are normal.     Palpations: Abdomen is soft.     Tenderness: There is no guarding.  Musculoskeletal:     Cervical back: Neck supple. No rigidity.     Right lower leg: No edema.     Left lower leg: No edema.  Skin:    General: Skin is warm.  Neurological:     Mental Status: He is alert and  oriented to person, place, and time.  Psychiatric:        Mood and Affect: Mood normal.        Behavior: Behavior normal.        No results found for any visits on 06/03/24.  The 10-year ASCVD risk score (Arnett DK, et al., 2019) is: 12.5%     Assessment & Plan:   Hyperlipidemia, unspecified hyperlipidemia type Assessment & Plan: Reviewed lipid panel from 04/2023.  The 10-year ASCVD risk score (Arnett DK, et al., 2019) is: 12.5%   Values used to calculate the score:     Age: 23 years     Clincally relevant sex: Male     Is Non-Hispanic African American: No     Diabetic: Yes     Tobacco smoker: No     Systolic Blood Pressure: 92 mmHg     Is BP treated: Yes     HDL Cholesterol: 36 mg/dL     Total Cholesterol: 185 mg/dL Recommend starting statin to reduce above ASCVd risk. Risks and benefits of statin discussed with the patient. Start Rosuvastatin  10 mg at bedtime.  F/U in 3 months to check fasting lipid panel, CMP.   Orders: -     Rosuvastatin  Calcium ; Take 1 tablet (10 mg total) by mouth daily.  Dispense: 90 tablet; Refill: 0 -     Lipid panel; Future -     Comprehensive metabolic panel with GFR; Future  Seasonal allergic rhinitis due to pollen Assessment & Plan: Chronic issue.  Suboptimally controlled.  He will take Zyrtec  10 mg daily during pollen season.   Orders: -     Cetirizine  HCl; Take 1 tablet (10 mg total) by mouth daily as needed for allergies.  Dispense: 90 tablet; Refill: 3  Essential hypertension Assessment & Plan: BP on lower side this morning. He takes Losartan  50 mg in  the morning. He has lost significant weight, changed lifestyle over the last year which likely has contributed to better blood pressure.  Recommend d/c losartan  (short half life), start Telmisartan  20 mg daily.  Check home BP 2-3 times a week, review home BP reading during next OV.    Orders: -     Telmisartan ; Take 1 tablet (20 mg total) by mouth daily.  Dispense: 90 tablet; Refill: 0  Mixed hyperlipidemia Assessment & Plan: Reviewed lipid panel from 04/2023.  The 10-year ASCVD risk score (Arnett DK, et al., 2019) is: 12.5%   Values used to calculate the score:     Age: 23 years     Clincally relevant sex: Male     Is Non-Hispanic African American: No     Diabetic: Yes     Tobacco smoker: No     Systolic Blood Pressure: 92 mmHg     Is BP treated: Yes     HDL Cholesterol: 36 mg/dL     Total Cholesterol: 185 mg/dL Recommend starting statin to reduce above ASCVd risk. Risks and benefits of statin discussed with the patient. Start Rosuvastatin  10 mg at bedtime.  F/U in 3 months to check fasting lipid panel, CMP.    Vertigo Assessment & Plan: Stable with occasional prn Meclizine . Discussed vestibular therapy in the future if symptoms reoccurs.    Rotator cuff tendinitis, left Assessment & Plan: Takes prn Meloxicam 7.5 mg less than one tablet per month. Pain, ROM has improved significantly.    Diet-controlled type 2 diabetes mellitus (HCC) Assessment & Plan: 07/01/23, A1c 8.4%, underwent significant lifestyle modifications, weight loss with  excellent diabetic control with A1c in normal range. Congratulated patient on managing his health and encouraged him on continuing with lifestyle modifications.    Constipation, unspecified constipation type Assessment & Plan: Occasional. Patient counseled on increasing daily fiber intake to 30 gm per day, water  intake 50 oz per day, continue regular exercise. If persistent recommend further evaluation, consider checking TSH, GI evaluation     Health care maintenance Assessment & Plan: Recommend updating shingles, pneumonia, tetanus immunization. Patient will check with pharmacy and his insurance to update these.  Had eye exam at [facility] in 11/2023, will obtain records.     I spent 40 minutes on the day of this face-to-face encounter reviewing the patient's medical and surgical history, medications, ongoing concerns, and reviewing the assessment and plan with the patient. This time also included counseling the patient on their health conditions and management options.   Return in about 3 months (around 09/03/2024) for statin f/u, labs couple days before appointment .   Luke Shade, MD

## 2024-06-03 NOTE — Assessment & Plan Note (Signed)
 07/01/23, A1c 8.4%, underwent significant lifestyle modifications, weight loss with excellent diabetic control with A1c in normal range. Congratulated patient on managing his health and encouraged him on continuing with lifestyle modifications.

## 2024-06-03 NOTE — Patient Instructions (Addendum)
-   If you are interested in the shingles vaccine series (Shingrix), call your insurance or pharmacy to check on coverage and location it must be given.  If affordable - you can schedule it here or at your pharmacy depending on coverage.   - You are also due for pneumonia and tetanus immunization. Please check with your insurance to see if you can get these updated at our office or if you need to get this updated through your local pharmacy.   - Discontinue Losartan . I am starting you on Telmisartan 20 mg once a day. Check BP 2-3 times a week and keep a track of BP reading. We can review your home BP reading during next visit.

## 2024-06-03 NOTE — Assessment & Plan Note (Signed)
 Reviewed lipid panel from 04/2023.  The 10-year ASCVD risk score (Arnett DK, et al., 2019) is: 12.5%   Values used to calculate the score:     Age: 63 years     Clincally relevant sex: Male     Is Non-Hispanic African American: No     Diabetic: Yes     Tobacco smoker: No     Systolic Blood Pressure: 92 mmHg     Is BP treated: Yes     HDL Cholesterol: 36 mg/dL     Total Cholesterol: 185 mg/dL Recommend starting statin to reduce above ASCVd risk. Risks and benefits of statin discussed with the patient. Start Rosuvastatin  10 mg at bedtime.  F/U in 3 months to check fasting lipid panel, CMP.

## 2024-06-03 NOTE — Assessment & Plan Note (Signed)
 Takes prn Meloxicam 7.5 mg less than one tablet per month. Pain, ROM has improved significantly.

## 2024-06-03 NOTE — Assessment & Plan Note (Signed)
 Recommend updating shingles, pneumonia, tetanus immunization. Patient will check with pharmacy and his insurance to update these.  Had eye exam at [facility] in 11/2023, will obtain records.

## 2024-06-03 NOTE — Assessment & Plan Note (Signed)
 BP on lower side this morning. He takes Losartan  50 mg in the morning. He has lost significant weight, changed lifestyle over the last year which likely has contributed to better blood pressure.  Recommend d/c losartan  (short half life), start Telmisartan 20 mg daily.  Check home BP 2-3 times a week, review home BP reading during next OV.

## 2024-06-03 NOTE — Assessment & Plan Note (Signed)
 Occasional. Patient counseled on increasing daily fiber intake to 30 gm per day, water  intake 50 oz per day, continue regular exercise. If persistent recommend further evaluation, consider checking TSH, GI evaluation

## 2024-06-03 NOTE — Assessment & Plan Note (Signed)
 Stable with occasional prn Meclizine . Discussed vestibular therapy in the future if symptoms reoccurs.

## 2024-06-03 NOTE — Assessment & Plan Note (Signed)
 Chronic issue.  Suboptimally controlled.  He will take Zyrtec  10 mg daily during pollen season.

## 2024-08-02 ENCOUNTER — Ambulatory Visit: Payer: Self-pay

## 2024-08-02 ENCOUNTER — Other Ambulatory Visit: Payer: Self-pay

## 2024-08-02 ENCOUNTER — Emergency Department
Admission: EM | Admit: 2024-08-02 | Discharge: 2024-08-03 | Disposition: A | Discharge status: Other Acute Inpatient Hospital | Attending: Emergency Medicine | Admitting: Emergency Medicine

## 2024-08-02 ENCOUNTER — Emergency Department

## 2024-08-02 DIAGNOSIS — R443 Hallucinations, unspecified: Secondary | ICD-10-CM

## 2024-08-02 DIAGNOSIS — I10 Essential (primary) hypertension: Secondary | ICD-10-CM | POA: Diagnosis not present

## 2024-08-02 DIAGNOSIS — N390 Urinary tract infection, site not specified: Secondary | ICD-10-CM

## 2024-08-02 DIAGNOSIS — E119 Type 2 diabetes mellitus without complications: Secondary | ICD-10-CM | POA: Diagnosis not present

## 2024-08-02 DIAGNOSIS — R440 Auditory hallucinations: Secondary | ICD-10-CM | POA: Diagnosis present

## 2024-08-02 DIAGNOSIS — F19151 Other psychoactive substance abuse with psychoactive substance-induced psychotic disorder with hallucinations: Secondary | ICD-10-CM | POA: Insufficient documentation

## 2024-08-02 DIAGNOSIS — Z85828 Personal history of other malignant neoplasm of skin: Secondary | ICD-10-CM | POA: Insufficient documentation

## 2024-08-02 DIAGNOSIS — F19959 Other psychoactive substance use, unspecified with psychoactive substance-induced psychotic disorder, unspecified: Secondary | ICD-10-CM

## 2024-08-02 LAB — URINE DRUG SCREEN, QUALITATIVE (ARMC ONLY)
Amphetamines, Ur Screen: POSITIVE — AB
Barbiturates, Ur Screen: NOT DETECTED
Benzodiazepine, Ur Scrn: NOT DETECTED
Cannabinoid 50 Ng, Ur ~~LOC~~: NOT DETECTED
Cocaine Metabolite,Ur ~~LOC~~: NOT DETECTED
MDMA (Ecstasy)Ur Screen: NOT DETECTED
Methadone Scn, Ur: NOT DETECTED
Opiate, Ur Screen: NOT DETECTED
Phencyclidine (PCP) Ur S: NOT DETECTED
Tricyclic, Ur Screen: NOT DETECTED

## 2024-08-02 LAB — CBC WITH DIFFERENTIAL/PLATELET
Abs Immature Granulocytes: 0.04 K/uL (ref 0.00–0.07)
Basophils Absolute: 0.1 K/uL (ref 0.0–0.1)
Basophils Relative: 0 %
Eosinophils Absolute: 0 K/uL (ref 0.0–0.5)
Eosinophils Relative: 0 %
HCT: 49.9 % (ref 39.0–52.0)
Hemoglobin: 16.2 g/dL (ref 13.0–17.0)
Immature Granulocytes: 0 %
Lymphocytes Relative: 8 %
Lymphs Abs: 1 K/uL (ref 0.7–4.0)
MCH: 28.6 pg (ref 26.0–34.0)
MCHC: 32.5 g/dL (ref 30.0–36.0)
MCV: 88 fL (ref 80.0–100.0)
Monocytes Absolute: 0.6 K/uL (ref 0.1–1.0)
Monocytes Relative: 5 %
Neutro Abs: 10.7 K/uL — ABNORMAL HIGH (ref 1.7–7.7)
Neutrophils Relative %: 87 %
Platelets: 242 K/uL (ref 150–400)
RBC: 5.67 MIL/uL (ref 4.22–5.81)
RDW: 12.8 % (ref 11.5–15.5)
WBC: 12.5 K/uL — ABNORMAL HIGH (ref 4.0–10.5)
nRBC: 0 % (ref 0.0–0.2)

## 2024-08-02 LAB — RESP PANEL BY RT-PCR (RSV, FLU A&B, COVID)  RVPGX2
Influenza A by PCR: NEGATIVE
Influenza B by PCR: NEGATIVE
Resp Syncytial Virus by PCR: NEGATIVE
SARS Coronavirus 2 by RT PCR: NEGATIVE

## 2024-08-02 LAB — URINALYSIS, W/ REFLEX TO CULTURE (INFECTION SUSPECTED)
Bacteria, UA: NONE SEEN
Bilirubin Urine: NEGATIVE
Glucose, UA: NEGATIVE mg/dL
Ketones, ur: 80 mg/dL — AB
Leukocytes,Ua: NEGATIVE
Nitrite: NEGATIVE
Protein, ur: 30 mg/dL — AB
RBC / HPF: >50 RBC/hpf (ref 0–5)
Specific Gravity, Urine: 1.021 (ref 1.005–1.030)
Squamous Epithelial / HPF: 0 /HPF (ref 0–5)
pH: 6 (ref 5.0–8.0)

## 2024-08-02 LAB — COMPREHENSIVE METABOLIC PANEL WITH GFR
ALT: 23 U/L (ref 0–44)
AST: 28 U/L (ref 15–41)
Albumin: 4.8 g/dL (ref 3.5–5.0)
Alkaline Phosphatase: 55 U/L (ref 38–126)
Anion gap: 15 (ref 5–15)
BUN: 12 mg/dL (ref 6–20)
CO2: 23 mmol/L (ref 22–32)
Calcium: 10.1 mg/dL (ref 8.9–10.3)
Chloride: 101 mmol/L (ref 98–111)
Creatinine, Ser: 1.16 mg/dL (ref 0.61–1.24)
GFR, Estimated: >60 mL/min (ref >60)
Glucose, Bld: 185 mg/dL — ABNORMAL HIGH (ref 70–99)
Potassium: 4.2 mmol/L (ref 3.5–5.1)
Sodium: 139 mmol/L (ref 135–145)
Total Bilirubin: 1 mg/dL (ref 0.0–1.2)
Total Protein: 8 g/dL (ref 6.5–8.1)

## 2024-08-02 LAB — ETHANOL: Alcohol, Ethyl (B): <15 mg/dL (ref <15)

## 2024-08-02 LAB — ACETAMINOPHEN LEVEL: Acetaminophen (Tylenol), Serum: <10 ug/mL — ABNORMAL LOW (ref 10–30)

## 2024-08-02 LAB — SALICYLATE LEVEL: Salicylate Lvl: <7 mg/dL — ABNORMAL LOW (ref 7.0–30.0)

## 2024-08-02 MED ORDER — ARIPIPRAZOLE 10 MG PO TABS
10.0000 mg | ORAL_TABLET | Freq: Every day | ORAL | Status: DC
  Administered 2024-08-02: 10 mg via ORAL
  Filled 2024-08-02: qty 1

## 2024-08-02 NOTE — Telephone Encounter (Signed)
 FYI Only or Action Required?: FYI only for provider.  Patient was last seen in primary care on 06/03/2024 by Joseph Bruckner, MD.  Called Nurse Triage reporting Panic Attack.  Symptoms began yesterday.  Interventions attempted: Other: relaxation exercises.  Symptoms are: stable.  Triage Disposition: See PCP When Office is Open (Within 3 Days)  Patient/caregiver understands and will follow disposition?: yes          Copied from CRM #[id number]. Topic: Clinical - Red Word Triage >> Aug 02, 2024  8:16 AM Suzen RAMAN wrote: Red Word that prompted transfer to Nurse Triage:  panic attack and  chest tightness. Requesting an appointment Reason for Disposition  Panic attacks are increasing in frequency  Answer Assessment - Initial Assessment Questions 1. CONCERN: Did anything happen that prompted you to call today?      Panic attacks  2. ANXIETY SYMPTOMS: Can you describe how you (your loved one; patient) have been feeling? (e.g., tense, restless, panicky, anxious, keyed up, overwhelmed, sense of impending doom).      Anxiety, panic attacks at any random time  3. ONSET: How long have you been feeling this way? (e.g., hours, days, weeks)     Yesterday and today 4. SEVERITY: How would you rate the level of anxiety? (e.g., 0 - 10; or mild, moderate, severe).     7-8/10 5. FUNCTIONAL IMPAIRMENT: How have these feelings affected your ability to do daily activities? Have you had more difficulty than usual doing your normal daily activities? (e.g., getting better, same, worse; self-care, school, work, interactions)     Marital issue,  6. HISTORY: Have you felt this way before? Have you ever been diagnosed with an anxiety problem in the past? (e.g., generalized anxiety disorder, panic attacks, PTSD). If Yes, ask: How was this problem treated? (e.g., medicines, counseling, etc.)     anxiety 7. RISK OF HARM - SUICIDAL IDEATION: Do you ever have thoughts of hurting or killing  yourself? If Yes, ask:  Do you have these feelings now? Do you have a plan on how you would do this?     yes 8. TREATMENT:  What has been done so far to treat this anxiety? (e.g., medicines, relaxation strategies). What has helped?     Relaxations  9. THERAPIST: Do you have a counselor or therapist? If Yes, ask: What is their name?     No  10. POTENTIAL TRIGGERS: Do you drink caffeinated beverages (e.g., coffee, colas, teas), and how much daily? Do you drink alcohol or use any drugs? Have you started any new medicines recently?       Marital issues 11. PATIENT SUPPORT: Who is with you now? Who do you live with? Do you have family or friends who you can talk to?        Wife Joseph Booth 12. OTHER SYMPTOMS: Do you have any other symptoms? (e.g., feeling depressed, trouble concentrating, trouble sleeping, trouble breathing, palpitations or fast heartbeat, chest pain, sweating, nausea, or diarrhea)       Hyperventilate then panic attack  Protocols used: Anxiety and Panic Attack-A-AH

## 2024-08-02 NOTE — ED Notes (Signed)
 Patient was transferred to room 20 to protect patient form leaving ER.

## 2024-08-02 NOTE — ED Notes (Signed)
 Patient got up and walked toward other patients room. Patient oriented back to the bed. Patient is completley confused asking.

## 2024-08-02 NOTE — ED Notes (Signed)
 Patient dressed out into wine colored scrubs per protocol.  All belongings given to wife at bedside.

## 2024-08-02 NOTE — ED Notes (Signed)
 Patient was attempting to leave. Patient was redirected into room 20.

## 2024-08-02 NOTE — ED Notes (Signed)
 Patient provided with ice water . This tech offered patient ice cream, but patient refused. Currently resting in bed.

## 2024-08-02 NOTE — ED Notes (Signed)
 Patient is resting comfortably in bed at this time. Provided with a warm blanket per request. No other needs stated.

## 2024-08-02 NOTE — ED Notes (Signed)
 VOL CONSULT  DONE  PENDING  PLACEMENT

## 2024-08-02 NOTE — ED Provider Notes (Signed)
 [facility] Provider Note    Event Date/Time   First MD Initiated Contact with Patient 08/02/24 1204     (approximate)   History   Anxiety and Medication Reaction   HPI  Jatavian Calica is a 63 year old male presenting to the ER for evaluation of hallucinations.  Patient reports he went to a house of man he met on a grinder.  That there, he was given a booty bump in his rectum before having consensual anal sex.  Reports he did not feel anything after the first 1 so he was given a second 1 after which he remembers feeling sleepy.  Since then, he has been having hallucinations.  Today he went to work, but before he got there felt like he is being surrounded by people.  Feels like he is hearing voices of children talking.  Denies SI, HI.  Denies history of hallucinations.  Reports history of depressive symptoms many years ago.     Physical Exam   Triage Vital Signs: ED Triage Vitals  Encounter Vitals Group     BP 08/02/24 1122 (!) 152/80     Girls Systolic BP Percentile --      Girls Diastolic BP Percentile --      Boys Systolic BP Percentile --      Boys Diastolic BP Percentile --      Pulse Rate 08/02/24 1122 (!) 111     Resp 08/02/24 1122 20     Temp 08/02/24 1122 98.1 F (36.7 C)     Temp Source 08/02/24 1122 Oral     SpO2 08/02/24 1122 100 %     Weight 08/02/24 1128 205 lb (93 kg)     Height 08/02/24 1128 5' 11 (1.803 m)     Head Circumference --      Peak Flow --      Pain Score 08/02/24 1125 0     Pain Loc --      Pain Education --      Exclude from Growth Chart --     Most recent vital signs: Vitals:   08/02/24 1122 08/02/24 1250  BP: (!) 152/80 (!) 167/95  Pulse: (!) 111 99  Resp: 20   Temp: 98.1 F (36.7 C)   SpO2: 100% 99%     General: Awake, interactive  CV:  Good peripheral perfusion.  Resp:  Unlabored respirations, lungs clear to auscultation Abd:  Nondistended Neuro:  Symmetric facial movement, fluid speech   ED  Results / Procedures / Treatments   Labs (all labs ordered are listed, but only abnormal results are displayed) Labs Reviewed  COMPREHENSIVE METABOLIC PANEL WITH GFR - Abnormal; Notable for the following components:      Result Value   Glucose, Bld 185 (*)    All other components within normal limits  CBC WITH DIFFERENTIAL/PLATELET - Abnormal; Notable for the following components:   WBC 12.5 (*)    Neutro Abs 10.7 (*)    All other components within normal limits  URINE DRUG SCREEN, QUALITATIVE ([facility] ONLY) - Abnormal; Notable for the following components:   Amphetamines, Ur Screen POSITIVE (*)    All other components within normal limits  URINALYSIS, W/ REFLEX TO CULTURE (INFECTION SUSPECTED) - Abnormal; Notable for the following components:   Color, Urine YELLOW (*)    APPearance HAZY (*)    Hgb urine dipstick MODERATE (*)    Ketones, ur 80 (*)    Protein, ur 30 (*)    All other  components within normal limits  ACETAMINOPHEN  LEVEL - Abnormal; Notable for the following components:   Acetaminophen  (Tylenol ), Serum <10 (*)    All other components within normal limits  SALICYLATE LEVEL - Abnormal; Notable for the following components:   Salicylate Lvl <7.0 (*)    All other components within normal limits  ETHANOL     EKG EKG independently reviewed and interpreted by myself demonstrates:    RADIOLOGY Imaging independently reviewed and interpreted by myself demonstrates:  CT head without acute bleed  Formal Radiology Read:  CT Head Wo Contrast Result Date: 08/02/2024 CLINICAL DATA:  Altered mental status, auditory hallucinations EXAM: CT HEAD WITHOUT CONTRAST TECHNIQUE: Contiguous axial images were obtained from the base of the skull through the vertex without intravenous contrast. RADIATION DOSE REDUCTION: This exam was performed according to the departmental dose-optimization program which includes automated exposure control, adjustment of the mA and/or kV according to patient  size and/or use of iterative reconstruction technique. COMPARISON:  None Available. FINDINGS: Brain: No evidence of acute infarction, hemorrhage, hydrocephalus, extra-axial collection or mass lesion/mass effect. Vascular: No hyperdense vessel or unexpected calcification. Skull: Normal. Negative for fracture or focal lesion. Sinuses/Orbits: No acute finding. Other: None. IMPRESSION: Negative head CT. Electronically Signed   By: Wilkie Lent M.D.   On: 08/02/2024 14:34    PROCEDURES:  Critical Care performed: No  Procedures   MEDICATIONS ORDERED IN ED: Medications - No data to display   IMPRESSION / MDM / ASSESSMENT AND PLAN / ED COURSE  I reviewed the triage vital signs and the nursing notes.  Differential diagnosis includes, but is not limited to, substance-induced mood disorder, primary psychiatric disorder, much lower suspicion acute intracranial process  Patient's presentation is most consistent with acute presentation with potential threat to life or bodily function.  63 year old male presenting with new onset hallucinations after use of unknown substance a few days ago.  Labs with overall reassuring CBC, CMP.  UA overall not suggestive of infection.  UDS positive for amphetamines.  EtOH negative.  Patient without SI or HI, but does appear to be distressed by his hallucinations.  Will consult psychiatry and TTS.  Do not feel there is an indication for IVC at this time.  The patient has been placed in psychiatric observation due to the need to provide a safe environment for the patient while obtaining psychiatric consultation and evaluation, as well as ongoing medical and medication management to treat the patient's condition.  The patient has not been placed under full IVC at this time.        FINAL CLINICAL IMPRESSION(S) / ED DIAGNOSES   Final diagnoses:  Hallucinations     Rx / DC Orders   ED Discharge Orders     None        Note:  This document was prepared  using Dragon voice recognition software and may include unintentional dictation errors.   Levander Slate, MD 08/02/24 [phone]

## 2024-08-02 NOTE — ED Triage Notes (Signed)
 Pt to ED with wife for medical eval. States hearing voices just saying random things like kids talking. Thought he saw someone in the kitchen last night. Pt is coherent, alert, oriented. States a couple days ago had anxiety attack and not sleeping well.   States everything started after he tried a booty bump liquid drug 3 days ago and not sure what was in it. It is a liquid that goes into the rectum with a syringe. He bought it online. Only psych hx was 20 years ago after mother passed away and was depressed.   Pt has no SI, HI or psych complaints.

## 2024-08-02 NOTE — BH Assessment (Signed)
 Adult MH  Referral information for Psychiatric Hospitalization faxed to:   [facility] [phone]- [phone]),   [address] [phone]),   [facility] [phone] -or- [phone]),   [facility] [phone]),   [facility] [phone] or [phone])   [facility] [phone] or [phone]),   [place] [phone]).

## 2024-08-02 NOTE — ED Notes (Signed)
 Wife was updated on CT results and updated on UA drug screen pending. At this time patient is waiting for psych team to see him and make a plan of care. Wife was instructed that any updates about POC would be called to her.

## 2024-08-02 NOTE — ED Notes (Signed)
 Wife at bedside. Waiting for CT results from provider.

## 2024-08-02 NOTE — BH Assessment (Signed)
 PATIENT BED AVAILABLE AFTER 7AM ON 08/03/24  Patient has been accepted to [facility].  Patient assigned to [facility] Accepting physician is Dr. Jess.  Call report to [phone].  Representative was [employer].   ER Staff is aware of it:  Tiffany ER Secretary  Dr. Floy, ER MD  Amy Patient's Nurse

## 2024-08-02 NOTE — ED Notes (Signed)
 PA in room now speaking with patient.

## 2024-08-02 NOTE — ED Notes (Signed)
Patient provided with warm blanket

## 2024-08-02 NOTE — Consult Note (Signed)
 [facility] Psychiatric Consult Initial  Patient Name: [name]  MRN: [id number]  DOB: 05-Oct-1961  Consult Order details:  Orders (From admission, onward)     Start     Ordered   08/02/24 1249  CONSULT TO CALL ACT TEAM       Ordering Provider: Levander Slate, MD  Provider:  (Not yet assigned)  Question:  Reason for Consult?  Answer:  Psych consult   08/02/24 1249   08/02/24 1249  IP CONSULT TO PSYCHIATRY       Ordering Provider: Levander Slate, MD  Provider:  (Not yet assigned)  Question:  Reason for consult:  Answer:  Medication management   08/02/24 1249             Mode of Visit: In person    Psychiatry Consult Evaluation  Service Date: August 02, 2024 LOS:  LOS: 0 days  Chief Complaint psychosis  Primary Psychiatric Diagnoses  Substance-induced psychosis   Assessment  Joseph Booth is a 63 y.o. male admitted: Presented to the ED with wife for evaluations reporting auditory hallucinations, visual hallucinations, anxiety, and paranoia.  Over the weekend patient went  to a party and dinner drug that he is referring to his booty bump that he believes to be methamphetamine.  UDS is positive for methamphetamine.  On exam he is disorganized he is frequently tangential and requires redirection.  Patient requires inpatient psychiatric hospitalization for medication management and stabilization due to severely impaired judgment.  Spoke with wife and she is agreeable with this plan.  Diagnoses:  Active Hospital problems: Principal Problem:   Psychoactive substance-induced psychosis (HCC)    Plan   ## Psychiatric Medication Recommendations:  Abilify  10 mg daily  ## Medical Decision Making Capacity: Not specifically addressed in this encounter  ## Further Work-up:   -- most recent EKG on 04/18/2019 had QtC of 427, repeat EKG ordered -- Pertinent labwork reviewed earlier this admission includes: UDS is positive for amphetamines urinalysis reviewed acetaminophen  level was  negative salicylates were negative ethanol was not detected CMP was notable for elevated blood glucose of 185 CBC reviewed white count was 12.5.  Head CT was performed and was unremarkable.   ## Disposition:--Patient requires inpatient psychiatric hospitalization when medically cleared.  Is currently agreeable to voluntary psychiatric hospitalization this has been discussed with him and his wife.  However given his severely impaired judgment if he attempts to leave he should be IVC.  ## Behavioral / Environmental: - No specific recommendations at this time.     ## Safety and Observation Level:  - Based on my clinical evaluation, I estimate the patient to be at low risk of self harm in the current setting. - At this time, we recommend  routine. This decision is based on my review of the chart including patient's history and current presentation, interview of the patient, mental status examination, and consideration of suicide risk including evaluating suicidal ideation, plan, intent, suicidal or self-harm behaviors, risk factors, and protective factors. This judgment is based on our ability to directly address suicide risk, implement suicide prevention strategies, and develop a safety plan while the patient is in the clinical setting. Please contact our team if there is a concern that risk level has changed.  CSSR Risk Category:C-SSRS RISK CATEGORY: No Risk  Suicide Risk Assessment: Patient has following modifiable risk factors for suicide: access to guns, which we are addressing by patient notified wife the guns need to be removed from home. Patient has following non-modifiable or  demographic risk factors for suicide: male gender and history of suicide attempt Patient has the following protective factors against suicide: Access to outpatient mental health care and Supportive family    Donnice FORBES Right, PA-C       History of Present Illness  Relevant Aspects of Hospital ED   Patient  Report:  Patient is a 63 year old male who presented to the ED for psychosis patient had been experiencing auditory and visual hallucinations increasing paranoia and anxiety.  On exam he is tangential and requires frequent redirection.  He is disorganized at times.  He is oriented to self location and day.  He reports he is experiencing derealization.  He reports his symptoms have been occurring for several days since using what he calls booty juice which is a drug that he took rectally that he believes to be methamphetamine.  He appears internally preoccupied during the interview.  He denies SI and HI.  He is a poor historian he reports previous history of psychiatric hospitalization roughly 15 years ago following the death of his mother.  He endorses prior suicide attempt.  He denies current psychiatric care.  He denies depressive symptoms.  He is guarded with respect to the content of the hallucinations.  Notes a medical history significant for hypertension, hyperlipidemia and previously diabetes reports most recent A1c is 4.2.  Reports he lives at home with his wife and works in Consulting civil engineer.  Reports intermittent alcohol use.  Denies further substance use.  He is an Designer, television/film set.  He does not receive services at the [facility].   Psych ROS:  Depression: Denies Anxiety: Denies   Collateral information:  Contacted Becker Christopher  at [phone]   Collateral from wife indicates that he would she was out of town this weekend and he went to a party and utilize drugs and has been behaving bizarrely ever since.  Reports today he attempted to go to work and had to stop because he was having hallucinations while driving.  His at that time they decided to bring him to the hospital.   Psychiatric and Social History  Psychiatric History:  Information collected from patient's spouse  Prev Dx/Sx: Anxiety depression Current Psych Provider: None Home Meds (current): None Previous Med Trials: Patient does not  recall Therapy: None  Prior Psych Hospitalization: Endorses around the time his mother died Prior Self Harm: Endorses suicidal gesture from the time his mother died reporting he tried to jump to a window Prior Violence: Denies  Family Psych History: None reported Family Hx suicide: None reported  Social History:   Educational Hx: Some college Occupational Hx: Music therapist Hx: Denies Living Situation: With spouse Spiritual Hx: None reported Access to weapons/lethal means: Endorses firearm access  Substance History Alcohol: Reports intermittent use History of alcohol withdrawal seizures denies History of DT's denies Illicit drugs: Amphetamines denies prior use Prescription drug abuse: None reported Rehab hx: None reported  Exam Findings  Physical Exam: Was alert, they were restless, normal respiratory effort, extraocular movements intact Vital Signs:  Temp:  [98.1 F (36.7 C)] 98.1 F (36.7 C) (09/23 1122) Pulse Rate:  [99-111] 99 (09/23 1250) Resp:  [20] 20 (09/23 1122) BP: (152-167)/(80-95) 167/95 (09/23 1250) SpO2:  [99 %-100 %] 99 % (09/23 1250) Weight:  [93 kg] 93 kg (09/23 1128) Blood pressure (!) 167/95, pulse 99, temperature 98.1 F (36.7 C), temperature source Oral, resp. rate 20, height 5' 11 (1.803 m), weight 93 kg, SpO2 99%. Body mass index is 28.59 kg/m.  Mental Status Exam: General Appearance: Disheveled  Orientation:  Full (Time, Place, and Person)  Memory:  Immediate;   Fair  Concentration:  Concentration: Poor  Recall:  Fair  Attention  Poor  Eye Contact:  Minimal  Speech:  Pressured  Language:  Fair  Volume:  Increased  Mood: elevated, anxious  Affect:  Congruent  Thought Process:  Disorganized  Thought Content:  Tangential  Suicidal Thoughts:  No  Homicidal Thoughts:  No  Judgement:  Impaired  Insight:  Lacking  Psychomotor Activity:  Restlessness  Akathisia:  NA  Fund of Knowledge:  Fair      Assets:  Chief Financial Officer (if indicated):        Other History   These have been pulled in through the EMR, reviewed, and updated if appropriate.  Family History:  The patient's family history includes Alcohol abuse in his mother; Mental illness in his sister; Squamous cell carcinoma in his father.  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   Basal cell carcinoma 02/20/2020   right lat forehead   Bladder stones    Chronic gastritis 02/2018   History of gastric ulcer    History of kidney stones    History of stress test 02-09-2018   dr hester   normal stress echo, normal RVSF, mild TR   Hypertension    cardiologist-  dr hester [facility] [place])   Mixed hyperlipidemia    Seasonal allergies    Skin lesion of face 01/03/2020   Sprain of metacarpophalangeal joint 11/06/2023   Tendinitis of upper biceps tendon of left shoulder 01/12/2019   Type 1 superior labrum extending from anterior to posterior (SLAP) lesion of left shoulder 04/22/2019   Type 2 diabetes mellitus with hyperglycemia (HCC) 07/15/2023   Type 2 diabetes with complication (HCC) 04/17/2024    Surgical History: Past Surgical History:  Procedure Laterality Date   CATARACT EXTRACTION W/PHACO Left 07/22/2023   Procedure: CATARACT EXTRACTION PHACO AND INTRAOCULAR LENS PLACEMENT (IOC) LEFT DIABETIC  10.42 00:41.7;  Surgeon: Mittie Gaskin, MD;  Location: [facility];  Service: Ophthalmology;  Laterality: Left;   CLOSED MANIPULATION SHOULDER WITH STERIOD INJECTION Left 09/20/2019   Procedure: CLOSED MANIPULATION SHOULDER WITH STEROID INJECTION;  Surgeon: Edie Norleen PARAS, MD;  Location: [facility];  Service: Orthopedics;  Laterality: Left;   COLONOSCOPY  last one 2016   CYSTOSCOPY WITH LITHOLAPAXY Right 07/14/2018   Procedure: CYSTOSCOPY WITH RIGHT RETROGRADE PYELOGRAM, RIGHT URETEROSCOPY WITH LASER LITHOTRIPSY AND STONE BASKETTING AND STENT PLACEMENT;  Surgeon:  Devere Lonni Righter, MD;  Location: [facility];  Service: Urology;  Laterality: Right;   EYE SURGERY     lasik   SHOULDER ARTHROSCOPY WITH OPEN ROTATOR CUFF REPAIR Left 04/21/2019   Procedure: SHOULDER ARTHROSCOPY WITH OPEN ROTATOR CUFF REPAIR;  Surgeon: Edie Norleen PARAS, MD;  Location: [facility];  Service: Orthopedics;  Laterality: Left;   TONSILLECTOMY  1969   TYMPANOPLASTY Right 2000   UPPER GASTROINTESTINAL ENDOSCOPY  last one 02-24-2018   WRIST GANGLION EXCISION Left ?     Medications:  No current facility-administered medications for this encounter.  Current Outpatient Medications:    cetirizine  (ZYRTEC ) 10 MG tablet, Take 1 tablet (10 mg total) by mouth daily as needed for allergies., Disp: 90 tablet, Rfl: 3   rosuvastatin  (CRESTOR ) 10 MG tablet, Take 1 tablet (10 mg total) by mouth daily., Disp: 90 tablet, Rfl: 0  telmisartan  (MICARDIS ) 20 MG tablet, Take 1 tablet (20 mg total) by mouth daily., Disp: 90 tablet, Rfl: 0   Blood Glucose Monitoring Suppl DEVI, 1 each by Does not apply route daily. May substitute to any manufacturer covered by patient's insurance., Disp: 1 each, Rfl: 0   Glucose Blood (BLOOD GLUCOSE TEST STRIPS) STRP, 1 each by In Vitro route daily. May substitute to any manufacturer covered by patient's insurance., Disp: 100 strip, Rfl: 3   Lancet Device MISC, 1 each by Does not apply route daily. May substitute to any manufacturer covered by patient's insurance., Disp: 1 each, Rfl: 3   Lancets Misc. MISC, 1 each by Does not apply route daily. May substitute to any manufacturer covered by patient's insurance., Disp: 100 each, Rfl: 3   meclizine  (ANTIVERT ) 12.5 MG tablet, Take 1 tablet (12.5 mg total) by mouth 3 (three) times daily as needed for dizziness. (Patient not taking: Reported on 08/02/2024), Disp: 30 tablet, Rfl: 0   meloxicam (MOBIC) 7.5 MG tablet, Take 7.5 mg by mouth as needed. (Patient not taking: Reported on 08/02/2024), Disp: , Rfl:    Allergies: Allergies  Allergen Reactions   Pollen Extract Other (See Comments)    Seasonal allergies    Donnice FORBES Right, PA-C

## 2024-08-03 ENCOUNTER — Emergency Department

## 2024-08-03 ENCOUNTER — Ambulatory Visit: Admitting: Internal Medicine

## 2024-08-03 MED ORDER — ONDANSETRON HCL 4 MG/2ML IJ SOLN
INTRAMUSCULAR | Status: AC
  Filled 2024-08-03: qty 2

## 2024-08-03 MED ORDER — LORAZEPAM 1 MG PO TABS
1.0000 mg | ORAL_TABLET | Freq: Once | ORAL | Status: AC
  Administered 2024-08-03: 1 mg via ORAL
  Filled 2024-08-03: qty 1

## 2024-08-03 MED ORDER — IOHEXOL 300 MG/ML  SOLN
100.0000 mL | Freq: Once | INTRAMUSCULAR | Status: AC | PRN
  Administered 2024-08-03: 100 mL via INTRAVENOUS

## 2024-08-03 MED ORDER — LORAZEPAM 1 MG PO TABS: 1.0000 mg | ORAL_TABLET | Freq: Three times a day (TID) | ORAL | Status: DC | PRN

## 2024-08-03 MED ORDER — SULFAMETHOXAZOLE-TRIMETHOPRIM 800-160 MG PO TABS
1.0000 | ORAL_TABLET | Freq: Two times a day (BID) | ORAL | Status: DC
Start: 2024-08-03 — End: 2024-08-03
  Administered 2024-08-03: 1 via ORAL
  Filled 2024-08-03 (×2): qty 1

## 2024-08-03 MED ORDER — ONDANSETRON HCL 4 MG/2ML IJ SOLN
4.0000 mg | Freq: Once | INTRAMUSCULAR | Status: AC
  Administered 2024-08-03: 4 mg via INTRAVENOUS

## 2024-08-03 MED ORDER — MORPHINE SULFATE (PF) 2 MG/ML IV SOLN
2.0000 mg | Freq: Once | INTRAVENOUS | Status: AC
  Administered 2024-08-03: 2 mg via INTRAVENOUS
  Filled 2024-08-03: qty 1

## 2024-08-03 MED ORDER — SULFAMETHOXAZOLE-TRIMETHOPRIM 800-160 MG PO TABS: 1.0000 | ORAL_TABLET | Freq: Two times a day (BID) | ORAL | 0 refills | Status: AC

## 2024-08-03 NOTE — Discharge Instructions (Signed)
 Please make sure to take the antibiotics as prescribed for UTI.

## 2024-08-03 NOTE — ED Notes (Signed)
 Pt has right lower abd pain.  Dr waymond aware and is with pt..   no v/d

## 2024-08-03 NOTE — ED Notes (Signed)
 [facility] waiver and paper copy of consent for transfer signed by pt.

## 2024-08-03 NOTE — ED Notes (Signed)
 Attempted to call report to [facility]. RN is currently unable to take report and will call back.

## 2024-08-03 NOTE — ED Notes (Signed)
 Report received from [place], [place].

## 2024-08-03 NOTE — ED Notes (Signed)
 Breakfast was provided at bedside

## 2024-08-03 NOTE — ED Notes (Signed)
 Pt to ct scan.

## 2024-08-03 NOTE — ED Provider Notes (Addendum)
 Emergency Medicine Observation Re-evaluation Note  Joseph Booth is a 63 y.o. male, seen on rounds today.  Pt initially presented to the ED for complaints of Anxiety and Medication Reaction Currently, the patient is resting.  Physical Exam  BP 134/87 (BP Location: Left Arm)   Pulse 98   Temp 98.7 F (37.1 C) (Oral)   Resp 17   Ht 5' 11 (1.803 m)   Wt 93 kg   SpO2 96%   BMI 28.59 kg/m  Physical Exam .Gen:  No acute distress Resp:  Breathing easily and comfortably, no accessory muscle usage Neuro:  Moving all four extremities, no gross focal neuro deficits Psych:  Resting currently, calm when awake   ED Course / MDM  EKG:   I have reviewed the labs performed to date as well as medications administered while in observation.  Recent changes in the last 24 hours include no acute events.  Plan  Current plan is for psych dispo.  2:00 AM: Patient complaining about right lower quadrant abdominal pain, abdomen soft, tender to the right lower quadrant without guarding.  Will give him a small dose of IV morphine  here as well as get a CT abdomen pelvis.  Considered appendicitis, colitis, nephrolithiasis given that there was blood noted in his prior urine sample.  CT imaging is negative for colitis, diverticulitis, appendicitis.  It does show possible cystitis, his UA does show RBCs and WBCs, patient states that he does have some discomfort with urinating.  No CVA tenderness.  Will start him on antibiotics for UTI.    Waymond Lorelle Cummins, MD 08/03/24 9862    Waymond Lorelle Cummins, MD 08/03/24 [phone]

## 2024-08-03 NOTE — ED Notes (Signed)
 Officer Portillo informed this RN that pt was feeling like he was breathing heavy. RN in room to assess pt. Pt tachypneic and appears flushed in the face. Pt states that he feels jittery. Pt instructed on deep breathing. EDP Viviann made aware and came to bedside to assess pt. PRN medication ordered for anxiety.

## 2024-08-03 NOTE — ED Notes (Signed)
 Pt reports that he is feeling better after taking ativan . Pt denies needs at this time.

## 2024-08-03 NOTE — ED Notes (Signed)
Pain meds given

## 2024-08-03 NOTE — ED Notes (Signed)
 Vitals have been done

## 2024-08-03 NOTE — ED Notes (Addendum)
 This RN went into patient room and introduced self. Pt denies SI/HI at this time. Pt is A & O x 4. Pt is having visual hallucinations, stating that he sees little guys on the floor trying to get up. Pt states that he is having a hard time knowing what is real and what is not. Pt is c/o dizziness when moving around. RN obtained orthostatic vital signs. Pts blood pressure drops when going from sitting to standing. EDP Stafford notified via secure chat, awaiting response. Pt educated on fall precautions and informed not to try to get out of bed without assistance. Fall bracelet placed on pt. Pt already has non-skid footwear on. Fall sign placed outside of patient's door.

## 2024-08-03 NOTE — ED Notes (Signed)
 Pt has nausea.  Meds ordered by dr waymond.

## 2024-08-03 NOTE — ED Notes (Signed)
EMTALA reviewed. 

## 2024-08-03 NOTE — ED Notes (Signed)
 Safe  transport called for  transfer to  [facility]

## 2024-08-29 ENCOUNTER — Other Ambulatory Visit: Payer: Self-pay

## 2024-08-29 DIAGNOSIS — I10 Essential (primary) hypertension: Secondary | ICD-10-CM

## 2024-08-29 DIAGNOSIS — E785 Hyperlipidemia, unspecified: Secondary | ICD-10-CM

## 2024-09-05 ENCOUNTER — Ambulatory Visit: Payer: Self-pay

## 2024-09-05 ENCOUNTER — Other Ambulatory Visit

## 2024-09-05 DIAGNOSIS — E785 Hyperlipidemia, unspecified: Secondary | ICD-10-CM

## 2024-09-05 LAB — COMPREHENSIVE METABOLIC PANEL WITH GFR
ALT: 15 U/L (ref 0–53)
AST: 15 U/L (ref 0–37)
Albumin: 4.5 g/dL (ref 3.5–5.2)
Alkaline Phosphatase: 51 U/L (ref 39–117)
BUN: 19 mg/dL (ref 6–23)
CO2: 28 meq/L (ref 19–32)
Calcium: 9.6 mg/dL (ref 8.4–10.5)
Chloride: 103 meq/L (ref 96–112)
Creatinine, Ser: 1.1 mg/dL (ref 0.40–1.50)
GFR: 72.77 mL/min (ref >60.00)
Glucose, Bld: 124 mg/dL — ABNORMAL HIGH (ref 70–99)
Potassium: 4.2 meq/L (ref 3.5–5.1)
Sodium: 139 meq/L (ref 135–145)
Total Bilirubin: 0.5 mg/dL (ref 0.2–1.2)
Total Protein: 7 g/dL (ref 6.0–8.3)

## 2024-09-05 LAB — LIPID PANEL
Cholesterol: 110 mg/dL (ref 0–200)
HDL: 33.8 mg/dL — ABNORMAL LOW (ref >39.00)
LDL Cholesterol: 62 mg/dL (ref 0–99)
NonHDL: 76.28
Total CHOL/HDL Ratio: 3
Triglycerides: 70 mg/dL (ref 0.0–149.0)
VLDL: 14 mg/dL (ref 0.0–40.0)

## 2024-09-05 NOTE — Progress Notes (Signed)
 Hold result, to be discussed during upcoming visit on 09/07/24.  Luke Shade, MD

## 2024-09-07 ENCOUNTER — Ambulatory Visit

## 2024-09-07 VITALS — BP 120/68 | HR 77 | Temp 98.7°F | Ht 71.0 in | Wt 216.2 lb

## 2024-09-07 DIAGNOSIS — I10 Essential (primary) hypertension: Secondary | ICD-10-CM

## 2024-09-07 DIAGNOSIS — Z113 Encounter for screening for infections with a predominantly sexual mode of transmission: Secondary | ICD-10-CM

## 2024-09-07 DIAGNOSIS — F19951 Other psychoactive substance use, unspecified with psychoactive substance-induced psychotic disorder with hallucinations: Secondary | ICD-10-CM

## 2024-09-07 DIAGNOSIS — E119 Type 2 diabetes mellitus without complications: Secondary | ICD-10-CM

## 2024-09-07 DIAGNOSIS — E782 Mixed hyperlipidemia: Secondary | ICD-10-CM

## 2024-09-07 DIAGNOSIS — K5790 Diverticulosis of intestine, part unspecified, without perforation or abscess without bleeding: Secondary | ICD-10-CM

## 2024-09-07 DIAGNOSIS — R42 Dizziness and giddiness: Secondary | ICD-10-CM

## 2024-09-07 DIAGNOSIS — E785 Hyperlipidemia, unspecified: Secondary | ICD-10-CM

## 2024-09-07 MED ORDER — RISPERIDONE 2 MG PO TABS: 2.0000 mg | ORAL_TABLET | Freq: Every day | ORAL | 0 refills | Status: DC

## 2024-09-07 MED ORDER — ROSUVASTATIN CALCIUM 10 MG PO TABS: 10.0000 mg | ORAL_TABLET | Freq: Every day | ORAL | 3 refills | Status: AC

## 2024-09-07 MED ORDER — TELMISARTAN 20 MG PO TABS: 20.0000 mg | ORAL_TABLET | Freq: Every day | ORAL | 3 refills | Status: AC

## 2024-09-07 NOTE — Assessment & Plan Note (Signed)
 Noted on CT abdomen from 08/03/24. No symptoms of diverticulitis. Education on dietary modifications provided. Advise increasing daily fiber intake to 25-30 grams. Encourage 40 ounces of water  daily. Recommend regular exercise and bowel movements. Provide educational materials on diverticulosis and dietary recommendations.

## 2024-09-07 NOTE — Assessment & Plan Note (Addendum)
 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 Currently on risperidone 2 mg at night  and hesitant for discontinuation of medication. Refer to outpatient psychiatry for medication management and further evaluation. Continue risperidone 2 mg once daily, till patient is evaluated by psychiatrist. PHQ-9, GAD-7 normal.  Orders:   Ambulatory referral to Psychiatry   risperiDONE (RISPERDAL) 2 MG tablet; Take 1 tablet (2 mg total) by mouth at bedtime.

## 2024-09-07 NOTE — Assessment & Plan Note (Signed)
 Managed with Rosuvastatin  10 mg daily, LDL reduced from 132 to 62 on recent lab from 09/05/24 which was discussed with the patient. Continue current medication. Refill sent. No side effects reported.

## 2024-09-07 NOTE — Assessment & Plan Note (Addendum)
 Potential exposure due to recent substance use and unprotected sexual activity. No symptoms reported. Order screening for HIV, hepatitis B, C, syphilis, gonorrhea, and chlamydia. Advised on safe sex practices and condom use. Orders:   HepB+HepC+HIV Panel   Chlamydia/GC NAA, Confirmation; Future   RPR

## 2024-09-07 NOTE — Progress Notes (Signed)
 Established Patient Office Visit   Subjective  Patient ID: Joseph Booth, male    DOB: 1961/03/30  Age: 63 y.o. MRN: [id number]  Chief Complaint  Patient presents with   Hyperlipidemia   Discussed the use of AI scribe software for clinical note transcription with the patient, who gave verbal consent to proceed.  History of Present Illness Patient presents for chronic medication management and has new concerns.   - During his appointment with me on 06/03/2024 he was started on Rosuvastatin  10 mg and antihypertensive medication Losartan  was discontinued to start Telmisartan  20 mg daily.  BP has been stable, no chest pain, palpitations. He is tolerating Rosuvastatin  well. Had repeat CMP and lipid panel recently.   He checks his blood pressure occasionally at work and at home, and it has been within normal limits. He notes a reduction in dizziness upon standing, which he previously experienced frequently.  - He was also recommended to update shingles, pneumonia, tetanus immunization through local pharmacy. He has not been able to update immunization yet. Plans on getting it updated with his wife sometime soon.    - Interval history: Seen at ED on 08/02/24 for anxiety/hallucinations, which he attributes to taking an unknown substance at a party. UDS was positive for Amphetamines (patient unaware what medication he was given anally, before having consensual sex with a person he met on grinder). He was prescribed risperidone during his hospital stay, which he takes once daily at bedtime instead of 2 mg BID. He discontinued hydroxyzine  and trazodone due to excessive drowsiness. He denies SI/HI. He follows up virtually with counselor and is not seeing a psychiatrist now. He practices safe sex. He is concerned about the potential for sexually transmitted infections following the party, as he does not recall all events clearly. He agrees to screening for STIs. No current symptoms such as penile discharge.  -   He experienced right-sided abdominal pain during this episode, and a CT scan revealed diverticulosis. He was also treated for a urinary tract infection with antibiotics. No current UTI symptoms such as fever or chills.  - He uses Zyrtec  as needed for allergies and Flonase twice daily. He occasionally uses meloxicam for left shoulder pain following surgery in 2020.    ROS As per HPI    Objective:     BP 120/68 (BP Location: Right Arm, Patient Position: Sitting, Cuff Size: Normal)   Pulse 77   Temp 98.7 F (37.1 C) (Oral)   Ht 5' 11 (1.803 m)   Wt 216 lb 3.2 oz (98.1 kg)   SpO2 95%   BMI 30.15 kg/m      09/07/2024    8:14 AM 06/03/2024   10:05 AM 04/18/2024   11:22 AM  Depression screen PHQ 2/9  Decreased Interest 0 0 0  Down, Depressed, Hopeless 0 0 0  PHQ - 2 Score 0 0 0  Altered sleeping 0 0 0  Tired, decreased energy 0 0 0  Change in appetite 0 0 0  Feeling bad or failure about yourself  0 0 0  Trouble concentrating 0 0 0  Moving slowly or fidgety/restless 0 0 0  Suicidal thoughts 0 0 0  PHQ-9 Score 0 0 0  Difficult doing work/chores Not difficult at all Not difficult at all Not difficult at all      09/07/2024    8:14 AM 06/03/2024   10:05 AM 04/18/2024   11:22 AM 10/19/2023    8:54 AM  GAD 7 : Generalized Anxiety  Score  Nervous, Anxious, on Edge 0 0 0 0  Control/stop worrying 0 0 0 0  Worry too much - different things 0 0 0 0  Trouble relaxing 0 0 0 0  Restless 0 0 0 0  Easily annoyed or irritable 0 0 0 0  Afraid - awful might happen 0 0 0 0  Total GAD 7 Score 0 0 0 0  Anxiety Difficulty Not difficult at all Not difficult at all Not difficult at all Not difficult at all      09/07/2024    8:14 AM 06/03/2024   10:05 AM 04/18/2024   11:22 AM  Depression screen PHQ 2/9  Decreased Interest 0 0 0  Down, Depressed, Hopeless 0 0 0  PHQ - 2 Score 0 0 0  Altered sleeping 0 0 0  Tired, decreased energy 0 0 0  Change in appetite 0 0 0  Feeling bad or failure  about yourself  0 0 0  Trouble concentrating 0 0 0  Moving slowly or fidgety/restless 0 0 0  Suicidal thoughts 0 0 0  PHQ-9 Score 0 0 0  Difficult doing work/chores Not difficult at all Not difficult at all Not difficult at all      09/07/2024    8:14 AM 06/03/2024   10:05 AM 04/18/2024   11:22 AM 10/19/2023    8:54 AM  GAD 7 : Generalized Anxiety Score  Nervous, Anxious, on Edge 0 0 0 0  Control/stop worrying 0 0 0 0  Worry too much - different things 0 0 0 0  Trouble relaxing 0 0 0 0  Restless 0 0 0 0  Easily annoyed or irritable 0 0 0 0  Afraid - awful might happen 0 0 0 0  Total GAD 7 Score 0 0 0 0  Anxiety Difficulty Not difficult at all Not difficult at all Not difficult at all Not difficult at all   SDOH Screenings   Food Insecurity: No Food Insecurity (09/03/2024)  Housing: Unknown (09/03/2024)  Transportation Needs: No Transportation Needs (09/03/2024)  Alcohol Screen: Low Risk  (09/03/2024)  Depression (PHQ2-9): Low Risk  (09/07/2024)  Financial Resource Strain: Low Risk  (09/03/2024)  Physical Activity: Insufficiently Active (09/03/2024)  Social Connections: Unknown (09/03/2024)  Stress: No Stress Concern Present (09/03/2024)  Tobacco Use: Low Risk  (09/07/2024)     Physical Exam Constitutional:      Appearance: Normal appearance. He is normal weight.  HENT:     Head: Normocephalic and atraumatic.     Mouth/Throat:     Mouth: Mucous membranes are moist.  Neck:     Thyroid: No thyroid mass or thyroid tenderness.  Cardiovascular:     Rate and Rhythm: Normal rate and regular rhythm.  Pulmonary:     Effort: Pulmonary effort is normal.     Breath sounds: Normal breath sounds.  Abdominal:     General: Bowel sounds are normal.     Palpations: Abdomen is soft.     Tenderness: There is no abdominal tenderness. There is no guarding.  Musculoskeletal:     Cervical back: Neck supple. No rigidity.     Right lower leg: No edema.     Left lower leg: No edema.   Skin:    General: Skin is warm.  Neurological:     Mental Status: He is alert and oriented to person, place, and time.  Psychiatric:        Mood and Affect: Mood normal.  Behavior: Behavior normal.        No results found for any visits on 09/07/24.  The ASCVD Risk score (Arnett DK, et al., 2019) failed to calculate for the following reasons:   The valid total cholesterol range is 130 to 320 mg/dL    Following lab results discussed: Component     Latest Ref Rng 09/05/2024  Sodium     135 - 145 mEq/L 139   Potassium     3.5 - 5.1 mEq/L 4.2   Chloride     96 - 112 mEq/L 103   CO2     19 - 32 mEq/L 28   Glucose     70 - 99 mg/dL 875 (H)   BUN     6 - 23 mg/dL 19   Creatinine     9.59 - 1.50 mg/dL 8.89   Total Bilirubin     0.2 - 1.2 mg/dL 0.5   Alkaline Phosphatase     39 - 117 U/L 51   AST     0 - 37 U/L 15   ALT     0 - 53 U/L 15   Total Protein     6.0 - 8.3 g/dL 7.0   Albumin     3.5 - 5.2 g/dL 4.5   GFR     >39.99 mL/min 72.77   Calcium      8.4 - 10.5 mg/dL 9.6   Cholesterol     0 - 200 mg/dL 889   Triglycerides     0.0 - 149.0 mg/dL 29.9   HDL Cholesterol     >39.00 mg/dL 66.19 (L)   VLDL     0.0 - 40.0 mg/dL 85.9   LDL (calc)     0 - 99 mg/dL 62   Total CHOL/HDL Ratio 3   NonHDL 76.28       Assessment & Plan:   Assessment & Plan Essential hypertension Well-controlled with telmisartan  20 mg daily, continue. No significant dizziness reported. Reviewed CMP from 09/05/24 which is stable.  Orders:   telmisartan  (MICARDIS ) 20 MG tablet; Take 1 tablet (20 mg total) by mouth daily.  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 Currently on risperidone 2 mg at night  and hesitant for discontinuation of medication. Refer to outpatient psychiatry for  medication management and further evaluation. Continue risperidone 2 mg once daily, till patient is evaluated by psychiatrist. PHQ-9, GAD-7 normal.  Orders:   Ambulatory referral to Psychiatry   risperiDONE (RISPERDAL) 2 MG tablet; Take 1 tablet (2 mg total) by mouth at bedtime.  Screening examination for STI Potential exposure due to recent substance use and unprotected sexual activity. No symptoms reported. Order screening for HIV, hepatitis B, C, syphilis, gonorrhea, and chlamydia. Advised on safe sex practices and condom use. Orders:   HepB+HepC+HIV Panel   Chlamydia/GC NAA, Confirmation; Future   RPR  Mixed hyperlipidemia Managed with Rosuvastatin  10 mg daily, LDL reduced from 132 to 62 on recent lab from 09/05/24 which was discussed with the patient. Continue current medication. Refill sent. No side effects reported.    Vertigo Symptoms stable, had taken Meclizine  in the past. Not needing medication. Will to monitor.      Diverticulosis Noted on CT abdomen from 08/03/24. No symptoms of diverticulitis. Education on dietary modifications provided. Advise increasing daily fiber intake to 25-30 grams. Encourage 40 ounces of water  daily. Recommend regular  exercise and bowel movements. Provide educational materials on diverticulosis and dietary recommendations.    Diet-controlled type 2 diabetes mellitus (HCC) Continue monitoring blood sugar at home. Lifestyle modifications recommended to continue to control blood glucose. Plan to check A1c at next visit.    Hyperlipidemia, unspecified hyperlipidemia type  Orders:   rosuvastatin  (CRESTOR ) 10 MG tablet; Take 1 tablet (10 mg total) by mouth daily.   I personally spent a total of 40 minutes in the care of the patient today including preparing to see the patient, getting/reviewing separately obtained history, performing a medically appropriate exam/evaluation, placing orders, referring and communicating with other health care  professionals, documenting clinical information in the EHR, independently interpreting results, and communicating results.  Return in about 3 months (around 12/08/2024) for labs non-fasting at patient's convinience and follow up with Dr. Abbey in 3 months.   Luke Abbey, MD

## 2024-09-07 NOTE — Progress Notes (Signed)
 Discussed results during OV on 09/07/24.  Luke Shade, MD

## 2024-09-07 NOTE — Patient Instructions (Addendum)
-   Increase daily fiber intake to 25-30 grams.  - Follow up in 3 months.

## 2024-09-07 NOTE — Assessment & Plan Note (Signed)
 Continue monitoring blood sugar at home. Lifestyle modifications recommended to continue to control blood glucose. Plan to check A1c at next visit.

## 2024-09-07 NOTE — Assessment & Plan Note (Addendum)
 Symptoms stable, had taken Meclizine  in the past. Not needing medication. Will to monitor.

## 2024-09-07 NOTE — Assessment & Plan Note (Signed)
 Well-controlled with telmisartan  20 mg daily, continue. No significant dizziness reported. Reviewed CMP from 09/05/24 which is stable.  Orders:   telmisartan  (MICARDIS ) 20 MG tablet; Take 1 tablet (20 mg total) by mouth daily.

## 2024-09-08 ENCOUNTER — Ambulatory Visit: Payer: Self-pay

## 2024-09-08 LAB — RPR: RPR Ser Ql: NONREACTIVE

## 2024-09-08 LAB — HEPB+HEPC+HIV PANEL
HIV Screen 4th Generation wRfx: NONREACTIVE
Hep B C IgM: NEGATIVE
Hep B Core Total Ab: POSITIVE — AB
Hep B E Ab: REACTIVE — AB
Hep B E Ag: NEGATIVE
Hep C Virus Ab: NONREACTIVE
Hepatitis B Surface Ag: NEGATIVE

## 2024-09-09 ENCOUNTER — Telehealth: Payer: Self-pay

## 2024-09-09 ENCOUNTER — Other Ambulatory Visit: Payer: Self-pay

## 2024-09-09 DIAGNOSIS — Z113 Encounter for screening for infections with a predominantly sexual mode of transmission: Secondary | ICD-10-CM

## 2024-09-09 NOTE — Telephone Encounter (Signed)
 Patient need lab orders.

## 2024-09-09 NOTE — Telephone Encounter (Signed)
 Joseph Booth

## 2024-09-09 NOTE — Telephone Encounter (Signed)
 Order was released, but ordered incorrectly per lab personnel. Order has been corrected and placed for Future and [facility].

## 2024-09-12 ENCOUNTER — Other Ambulatory Visit

## 2024-09-19 ENCOUNTER — Telehealth: Payer: Self-pay

## 2024-09-19 DIAGNOSIS — F419 Anxiety disorder, unspecified: Secondary | ICD-10-CM

## 2024-09-19 MED ORDER — HYDROXYZINE HCL 10 MG PO TABS: 10.0000 mg | ORAL_TABLET | Freq: Every day | ORAL | 0 refills | Status: DC | PRN

## 2024-09-19 NOTE — Telephone Encounter (Signed)
 Patient needs appointment with me to follow up on mood. Virtual visit is okay.   Thank you,  Luke Shade, MD

## 2024-09-19 NOTE — Telephone Encounter (Signed)
 Patient called back and I scheduled an appointment for him to see Dr. Luke Shade on 09/22/2024.  Patient states he would prefer an in-person visit, so I switched it from virtual to in-person.

## 2024-09-19 NOTE — Telephone Encounter (Signed)
 Noted

## 2024-09-19 NOTE — Telephone Encounter (Signed)
 Patient scheduled an appointment via MyChart on 10/26/2024 to see Dr. Kalpana Bair with the following comment:  Anxiety Medication

## 2024-09-19 NOTE — Telephone Encounter (Signed)
 Noted. Provider was made aware via MyChart.

## 2024-09-22 ENCOUNTER — Ambulatory Visit

## 2024-09-22 VITALS — BP 130/78 | HR 74 | Temp 99.1°F | Ht 71.0 in | Wt 208.0 lb

## 2024-09-22 DIAGNOSIS — F39 Unspecified mood [affective] disorder: Secondary | ICD-10-CM | POA: Diagnosis not present

## 2024-09-22 MED ORDER — VENLAFAXINE HCL ER 75 MG PO CP24: 75.0000 mg | ORAL_CAPSULE | Freq: Every day | ORAL | 0 refills | Status: AC

## 2024-09-22 MED ORDER — VENLAFAXINE HCL ER 37.5 MG PO CP24: 37.5000 mg | ORAL_CAPSULE | Freq: Every day | ORAL | 0 refills | Status: DC

## 2024-09-22 NOTE — Assessment & Plan Note (Signed)
 He was referred to psychiatry during his last visit with me but has not been able to establish care with one. He starts counseling next week. Has some interpersonal and family conflict which is making anxiety worse. Hydroxyzine  25 mg made him groggy, sleepy in the morning. Hydroxyzine  10 mg at bedtime helps with sleep.  - No SI/HI.  - Continue hydroxyzine  10 mg as needed at night. - Continue Risperidone 2 mg at night.  - Start venlafaxine 37.5 mg daily for two weeks, then increase to 75 mg daily in the morning. Potential medication s/e including increased self harming thoughts, gi side effects discussed. Recommend patient reaches out to us  if he develops side effects or is not able to tolerate medication. Recommend emergent evaluation in ED SI/HI.   - Referred to Dr. Chipper for psychiatric evaluation. - Encouraged counseling sessions starting next week. - Advised on journaling and sensory techniques for anxiety management.  Orders:   venlafaxine XR (EFFEXOR-XR) 37.5 MG 24 hr capsule; Take 1 capsule (37.5 mg total) by mouth daily with breakfast for 14 days.   venlafaxine XR (EFFEXOR-XR) 75 MG 24 hr capsule; Take 1 capsule (75 mg total) by mouth daily with breakfast.   Ambulatory referral to Psychiatry

## 2024-09-22 NOTE — Progress Notes (Signed)
 Acute Office Visit  Subjective:    Patient ID: Joseph Booth, male    DOB: [birth year], 63 y.o.   MRN: [id number]  Chief Complaint  Patient presents with   Anxiety    Discussed the use of AI scribe software for clinical note transcription with the patient, who gave verbal consent to proceed.  History of Present Illness Joseph Booth is a 63 year old male who presents with increased anxiety symptoms. Patient reports since his hospitalization in 08/02/24 for psychoactive substance induced hallucinations he has noted conflict within himself and his family. He reports of significant anxiety, described as a 'knot in my chest' and frequent jaw clenching, attributed to stressors at home, including marital issues and strained relationships with his children. Symptoms tend to subside by bedtime but return upon waking. He takes hydroxyzine  10 mg at night to manage anxiety, which helps reduce the 'knot' sensation by bedtime. However, he experiences significant drowsiness with higher doses, so he limits its use to nighttime only. He feels fine upon waking but becomes anxious again as the day progresses.  He continues to take risperidone 2 mg which was started during his hospitalization, he is hesitant to discontinue it. He has not experienced any hallucinations while on the medication.  He is scheduled to see a counselor next week through his religious organization.   He is trying to maintain a healthy lifestyle by eating balanced meals and staying active, despite the challenges posed by his mental health. He mentions attending band practice and making efforts to eat in moderation. No recent alcohol consumption. No issues with diarrhea or constipation.  No thoughts of self-harm, hallucinations, diarrhea, constipation, or alcohol consumption. He reports feeling dizzy with deep breathing exercises.     ROS As per HPI    Objective:    BP 130/78 (BP Location: Left Arm, Patient Position: Sitting, Cuff  Size: Normal)   Pulse 74   Temp 99.1 F (37.3 C) (Oral)   Ht 5' 11 (1.803 m)   Wt 208 lb (94.3 kg)   SpO2 95%   BMI 29.01 kg/m    Physical Exam Constitutional:      General: He is not in acute distress. HENT:     Head: Normocephalic and atraumatic.  Cardiovascular:     Rate and Rhythm: Normal rate.     Heart sounds: No murmur heard. Pulmonary:     Effort: Pulmonary effort is normal.     Breath sounds: Normal breath sounds.  Abdominal:     General: Bowel sounds are normal.     Palpations: Abdomen is soft.  Neurological:     Mental Status: He is alert and oriented to person, place, and time.  Psychiatric:        Mood and Affect: Mood is depressed. Affect is tearful.        Speech: Speech normal.        Behavior: Behavior is cooperative.        Thought Content: Thought content is not paranoid. Thought content does not include homicidal or suicidal ideation. Thought content does not include homicidal or suicidal plan.     No results found for any visits on 09/22/24.     Assessment & Plan:  Patient is a pleasant 63 year old male presenting for evaluation of worsening anxiety.  Assessment & Plan Mood disorder He was referred to psychiatry during his last visit with me but has not been able to establish care with one. He starts counseling next week. Has some  interpersonal and family conflict which is making anxiety worse. Hydroxyzine  25 mg made him groggy, sleepy in the morning. Hydroxyzine  10 mg at bedtime helps with sleep.  - No SI/HI.  - Continue hydroxyzine  10 mg as needed at night. - Continue Risperidone 2 mg at night.  - Start venlafaxine 37.5 mg daily for two weeks, then increase to 75 mg daily in the morning. Potential medication s/e including increased self harming thoughts, gi side effects discussed. Recommend patient reaches out to us  if he develops side effects or is not able to tolerate medication. Recommend emergent evaluation in ED SI/HI.   - Referred to Dr.  Chipper for psychiatric evaluation. - Encouraged counseling sessions starting next week. - Advised on journaling and sensory techniques for anxiety management.  Orders:   venlafaxine XR (EFFEXOR-XR) 37.5 MG 24 hr capsule; Take 1 capsule (37.5 mg total) by mouth daily with breakfast for 14 days.   venlafaxine XR (EFFEXOR-XR) 75 MG 24 hr capsule; Take 1 capsule (75 mg total) by mouth daily with breakfast.   Ambulatory referral to Psychiatry    Return in about 8 weeks (around 11/17/2024) for or sooner for mood follow up .  Joseph Shade, MD

## 2024-10-05 ENCOUNTER — Other Ambulatory Visit: Payer: Self-pay

## 2024-10-05 MED ORDER — ACCU-CHEK GUIDE TEST VI STRP
1.0000 | ORAL_STRIP | Freq: Every day | 3 refills | Status: AC
Start: 2024-10-05 — End: ?

## 2024-10-26 ENCOUNTER — Ambulatory Visit

## 2024-11-30 ENCOUNTER — Ambulatory Visit

## 2024-12-14 ENCOUNTER — Ambulatory Visit: Payer: Self-pay

## 2024-12-14 ENCOUNTER — Ambulatory Visit

## 2024-12-14 VITALS — BP 96/70 | HR 89 | Temp 99.6°F | Ht 71.0 in | Wt 192.8 lb

## 2024-12-14 DIAGNOSIS — B351 Tinea unguium: Secondary | ICD-10-CM | POA: Insufficient documentation

## 2024-12-14 DIAGNOSIS — E782 Mixed hyperlipidemia: Secondary | ICD-10-CM

## 2024-12-14 DIAGNOSIS — E119 Type 2 diabetes mellitus without complications: Secondary | ICD-10-CM

## 2024-12-14 DIAGNOSIS — R7989 Other specified abnormal findings of blood chemistry: Secondary | ICD-10-CM | POA: Insufficient documentation

## 2024-12-14 DIAGNOSIS — F39 Unspecified mood [affective] disorder: Secondary | ICD-10-CM

## 2024-12-14 DIAGNOSIS — I10 Essential (primary) hypertension: Secondary | ICD-10-CM

## 2024-12-14 DIAGNOSIS — I2510 Atherosclerotic heart disease of native coronary artery without angina pectoris: Secondary | ICD-10-CM | POA: Insufficient documentation

## 2024-12-14 DIAGNOSIS — Z0189 Encounter for other specified special examinations: Secondary | ICD-10-CM

## 2024-12-14 DIAGNOSIS — Z7251 High risk heterosexual behavior: Secondary | ICD-10-CM | POA: Insufficient documentation

## 2024-12-14 LAB — COMPREHENSIVE METABOLIC PANEL WITH GFR
ALT: 16 U/L (ref 3–53)
AST: 15 U/L (ref 5–37)
Albumin: 4.5 g/dL (ref 3.5–5.2)
Alkaline Phosphatase: 46 U/L (ref 39–117)
BUN: 13 mg/dL (ref 6–23)
CO2: 32 meq/L (ref 19–32)
Calcium: 9.6 mg/dL (ref 8.4–10.5)
Chloride: 102 meq/L (ref 96–112)
Creatinine, Ser: 1.06 mg/dL (ref 0.40–1.50)
GFR: 75.93 mL/min
Glucose, Bld: 107 mg/dL — ABNORMAL HIGH (ref 70–99)
Potassium: 4.3 meq/L (ref 3.5–5.1)
Sodium: 141 meq/L (ref 135–145)
Total Bilirubin: 0.9 mg/dL (ref 0.2–1.2)
Total Protein: 6.9 g/dL (ref 6.0–8.3)

## 2024-12-14 LAB — CBC WITH DIFFERENTIAL/PLATELET
Basophils Absolute: 0 10*3/uL (ref 0.0–0.1)
Basophils Relative: 0.5 % (ref 0.0–3.0)
Eosinophils Absolute: 0.1 10*3/uL (ref 0.0–0.7)
Eosinophils Relative: 1.7 % (ref 0.0–5.0)
HCT: 43 % (ref 39.0–52.0)
Hemoglobin: 14.8 g/dL (ref 13.0–17.0)
Lymphocytes Relative: 16.1 % (ref 12.0–46.0)
Lymphs Abs: 1 10*3/uL (ref 0.7–4.0)
MCHC: 34.4 g/dL (ref 30.0–36.0)
MCV: 84.6 fl (ref 78.0–100.0)
Monocytes Absolute: 0.4 10*3/uL (ref 0.1–1.0)
Monocytes Relative: 6 % (ref 3.0–12.0)
Neutro Abs: 4.5 10*3/uL (ref 1.4–7.7)
Neutrophils Relative %: 75.7 % (ref 43.0–77.0)
Platelets: 205 10*3/uL (ref 150.0–400.0)
RBC: 5.08 Mil/uL (ref 4.22–5.81)
RDW: 14.2 % (ref 11.5–15.5)
WBC: 5.9 10*3/uL (ref 4.0–10.5)

## 2024-12-14 LAB — HEMOGLOBIN A1C: Hgb A1c MFr Bld: 5.5 % (ref 4.6–6.5)

## 2024-12-14 LAB — CK: Total CK: 61 U/L (ref 17–232)

## 2024-12-14 NOTE — Assessment & Plan Note (Signed)
 Onychomycosis of bilateral toenails, left worse than right. Conservative management as patient declines podiatry referral (due to insurance concerns). Educated on daily foot hygiene: wash and dry feet thoroughly, especially between toes. Keep toenails trimmed straight across and filed to reduce thickness. Monitor for skin breakdown, pain, redness, drainage, or signs of secondary infection and report promptly. Reassess at follow-up or sooner if symptoms worsen.

## 2024-12-14 NOTE — Assessment & Plan Note (Addendum)
 Continue rosuvastatin  10 mg daily.  Will check CK as he was found to have mildly elevated at [facility] ED on 12/06/2024.  No abdominal pain.  Exam reassuring. Orders:   CK (Creatine Kinase)

## 2024-12-14 NOTE — Patient Instructions (Addendum)
 Please update: shingles, pneumonia, tetanus immunization through local pharmacy.  Follow up with Dr. Abbey in 2 months  You are due for diabetic eye exam. Please update that and have your eye doctor send us  record of your eye exam.

## 2024-12-14 NOTE — Assessment & Plan Note (Addendum)
 Blood pressure 96 x 70 mmHg today.  Currently taking telmisartan  20 mg daily. Blood pressure fluctuating, possibly due to inadequate hydration. Currently on telmisartan  for 24-hour coverage. Continue telmisartan . Encouraged regular meals and adequate hydration.  Check CMP. Orders:   Comp Met (CMET)

## 2024-12-14 NOTE — Assessment & Plan Note (Signed)
 CTA chest and pelvis from [facility] ED on 12/06/2024 was suspicious for small patent foreman ovale and significant coronary artery calcification.  Patient is asymptomatic today.  He is currently on rosuvastatin  10 mg daily.  Continue.  Recommend cardiology evaluation, patient reached out via phone call after his office visit was over to update on cardiology referral recommendation.  Patient agrees with the plan.  Referral made. Orders:   Ambulatory referral to Cardiology

## 2024-12-14 NOTE — Assessment & Plan Note (Addendum)
 No SI, HI.  Mood stable. No longer taking hydroxyzine , risperidone .  Currently on venlafaxine  75 mg daily and follows with Dr. Chipper.

## 2024-12-14 NOTE — Assessment & Plan Note (Addendum)
 A1c improved to normal range through lifestyle modifications. Discussed potential need for medication if A1c exceeds 7.5%. Repeated A1c to monitor glucose control. Encouraged continued lifestyle modifications including low carbohydrate diet and regular meals. Discussed potential use of metformin if A1c exceeds 7.5%. Also recommended:  - Update shingles, pneumonia, and tetanus booster vaccines. - Consider COVID-19 vaccine.  He declined this today. - Schedule diabetic eye exam.  Last done on 11/30/2023 per patient.   Orders:   HgB A1c

## 2024-12-14 NOTE — Assessment & Plan Note (Addendum)
 Patient was seen at [facility] ED on 12/06/2024 for atypical chest pain.  Patient was found to have sodium of 133, potassium 3.3, positive amphetamine/methamphetamine screening on urine, POC glucose was 194, mildly elevated CK at 241, mildly elevated WBC.  Repeat CK, CBC, CMP for follow-up.   Orders:   CBC w/Diff

## 2024-12-14 NOTE — Assessment & Plan Note (Signed)
 Recent history of unprotected sex. On Truvada with [place] through [facility]. Screened for STI during his recent visit which was reassuring. Advised on safe sex practice done.  Continue current post-exposure prophylaxis regimen. Follow up with public health department for PrEP discussion after 28-day course.

## 2024-12-14 NOTE — Progress Notes (Addendum)
 "  Established Patient Office Visit   Subjective  Patient ID: Joseph Booth, male    DOB: 1961/02/24  Age: 64 y.o. MRN: [id number]  Chief Complaint  Patient presents with   Mood    Discussed the use of AI scribe software for clinical note transcription with the patient, who gave verbal consent to proceed.  History of Present Illness Joseph Booth is a 64 year old male with hypertension and diabetes who presents for medication management and follow-up after an emergency department visit for atypical chest pain on 12/06/24 at [facility].   The chest pain was described as frightening, with a sensation of his head feeling like it was going to explode.  Patient reports no chest pain today.  He was found to have abnormal electrolytes including mildly low sodium, potassium, elevated blood glucose, mildly elevated WBC, UDS was positive for amphetamine/methamphetamine.  CTA chest and abdomen done on 12/06/2024 suspicious for small patent foreman ovale, moderate coronary calcification. He has no current chest pain, shortness of breath, or palpitations.   He has a history of hypertension and is currently taking telmisartan . He has not been able to monitor his blood pressure at home due to the lack of a blood pressure cuff.   He has a history of diabetes, with a previous A1c of 8.4%. He occasionally monitors his blood glucose at home but often forgets in the mornings. He has not experienced symptoms of hyperglycemia such as excessive thirst or frequent urination. He is not currently on any glucose-lowering medication but is mindful of his diet, trying to eat healthier foods and maintain hydration.  He is on a 28-day course of Truvada and another medication for post-exposure prophylaxis following a recent encounter.  [facility].  He reports a fall on ice, resulting in muscle pain around his back and neck, particularly under the shoulder blade. The pain has  improved but is still present with certain movements.  He is not currently taking risperidone  or hydroxyzine , but he is on venlafaxine  75 mg and takes trazodone as needed for sleep. He also uses Zyrtec  and Sudafed as needed.     ROS As per HPI    Objective:     BP 96/70 (BP Location: Right Arm, Patient Position: Sitting)   Pulse 89   Temp 99.6 F (37.6 C) (Oral)   Ht 5' 11 (1.803 m)   Wt 192 lb 12.8 oz (87.5 kg)   SpO2 97%   BMI 26.89 kg/m      12/14/2024    8:13 AM 09/22/2024    4:38 PM 09/07/2024    8:14 AM  Depression screen PHQ 2/9  Decreased Interest 0 0 0  Down, Depressed, Hopeless 0 0 0  PHQ - 2 Score 0 0 0  Altered sleeping 0 0 0  Tired, decreased energy 0 0 0  Change in appetite 0 0 0  Feeling bad or failure about yourself  0 1 0  Trouble concentrating 0 0 0  Moving slowly or fidgety/restless 0 0 0  Suicidal thoughts 0 0 0  PHQ-9 Score 0 1 0   Difficult doing work/chores Not difficult at all  Not difficult at all     Data saved with a previous flowsheet row definition      12/14/2024    8:13 AM 09/22/2024    4:38 PM 09/07/2024    8:14 AM 06/03/2024   10:05 AM  GAD 7 : Generalized Anxiety Score  Nervous, Anxious, on Edge 0 2  0  0   Control/stop worrying 0 1  0  0   Worry too much - different things 0 0  0  0   Trouble relaxing 0 0  0  0   Restless 0 0  0  0   Easily annoyed or irritable 0 0  0  0   Afraid - awful might happen 0 0  0  0   Total GAD 7 Score 0 3 0 0  Anxiety Difficulty Not difficult at all Not difficult at all Not difficult at all Not difficult at all     Data saved with a previous flowsheet row definition      12/14/2024    8:13 AM 09/22/2024    4:38 PM 09/07/2024    8:14 AM  Depression screen PHQ 2/9  Decreased Interest 0 0 0  Down, Depressed, Hopeless 0 0 0  PHQ - 2 Score 0 0 0  Altered sleeping 0 0 0  Tired, decreased energy 0 0 0  Change in appetite 0 0 0  Feeling bad or failure about yourself  0 1 0  Trouble  concentrating 0 0 0  Moving slowly or fidgety/restless 0 0 0  Suicidal thoughts 0 0 0  PHQ-9 Score 0 1 0   Difficult doing work/chores Not difficult at all  Not difficult at all     Data saved with a previous flowsheet row definition      12/14/2024    8:13 AM 09/22/2024    4:38 PM 09/07/2024    8:14 AM 06/03/2024   10:05 AM  GAD 7 : Generalized Anxiety Score  Nervous, Anxious, on Edge 0 2  0  0   Control/stop worrying 0 1  0  0   Worry too much - different things 0 0  0  0   Trouble relaxing 0 0  0  0   Restless 0 0  0  0   Easily annoyed or irritable 0 0  0  0   Afraid - awful might happen 0 0  0  0   Total GAD 7 Score 0 3 0 0  Anxiety Difficulty Not difficult at all Not difficult at all Not difficult at all Not difficult at all     Data saved with a previous flowsheet row definition   SDOH Screenings   Food Insecurity: No Food Insecurity (09/03/2024)  Housing: Unknown (12/06/2024)   Received from [facility]  Transportation Needs: No Transportation Needs (09/03/2024)  Alcohol Screen: Low Risk (09/03/2024)  Depression (PHQ2-9): Low Risk (12/14/2024)  Financial Resource Strain: Low Risk (09/03/2024)  Physical Activity: Insufficiently Active (09/03/2024)  Social Connections: Unknown (09/03/2024)  Stress: No Stress Concern Present (09/03/2024)  Tobacco Use: Low Risk (12/14/2024)     Physical Exam Constitutional:      Appearance: Normal appearance.  HENT:     Head: Normocephalic and atraumatic.     Mouth/Throat:     Mouth: Mucous membranes are moist.  Neck:     Thyroid: No thyroid mass or thyroid tenderness.  Cardiovascular:     Rate and Rhythm: Normal rate and regular rhythm.  Pulmonary:     Effort: Pulmonary effort is normal.     Breath sounds: Normal breath sounds.  Abdominal:     General: Bowel sounds are normal.     Palpations: Abdomen is soft.     Tenderness: There is no abdominal tenderness. There is no guarding.  Musculoskeletal:      Cervical back: Neck supple. No rigidity or tenderness.     Right lower leg: No edema.     Left lower leg: No edema.  Skin:    General: Skin is warm.  Neurological:     Mental Status: He is alert and oriented to person, place, and time.     Motor: No weakness.     Gait: Gait normal.  Psychiatric:        Mood and Affect: Mood normal. Mood is not anxious.        Behavior: Behavior normal. Behavior is cooperative.        Thought Content: Thought content does not include homicidal or suicidal ideation. Thought content does not include homicidal or suicidal plan.        No results found for any visits on 12/14/24.  The ASCVD Risk score (Arnett DK, et al., 2019) failed to calculate for the following reasons:   The valid total cholesterol range is 130 to 320 mg/dL     Assessment & Plan:  Patient is a pleasant 64 year old male presenting for medication management.  Patient was seen at [facility] ED on 12/06/2024 for atypical chest pain.  Patient was found to have sodium of 133, potassium 3.3, positive amphetamine/methamphetamine screening on urine, POC glucose was 194, mildly elevated CK at 241, mildly elevated WBC.  Repeat CK, CBC, CMP for follow-up.  No chest pain reported during today's visit. Assessment & Plan Essential hypertension Blood pressure 96 x 70 mmHg today.  Currently taking telmisartan  20 mg daily. Blood pressure fluctuating, possibly due to inadequate hydration. Currently on telmisartan  for 24-hour coverage. Continue telmisartan . Encouraged regular meals and adequate hydration.  Check CMP. Orders:   Comp Met (CMET)  Abnormal CBC Patient was seen at [facility] ED on 12/06/2024 for atypical chest pain.  Patient was found to have sodium of 133, potassium 3.3, positive amphetamine/methamphetamine screening on urine, POC glucose was 194, mildly elevated CK at 241, mildly elevated WBC.  Repeat CK, CBC, CMP for follow-up.   Orders:   CBC w/Diff  Diet-controlled type 2 diabetes mellitus  (HCC) A1c improved to normal range through lifestyle modifications. Discussed potential need for medication if A1c exceeds 7.5%. Repeated A1c to monitor glucose control. Encouraged continued lifestyle modifications including low carbohydrate diet and regular meals. Discussed potential use of metformin if A1c exceeds 7.5%. Also recommended:  - Update shingles, pneumonia, and tetanus booster vaccines. - Consider COVID-19 vaccine.  He declined this today. - Schedule diabetic eye exam.  Last done on 11/30/2023 per patient.   Orders:   HgB A1c  Mixed hyperlipidemia Continue rosuvastatin  10 mg daily.  Will check CK as he was found to have mildly elevated at [facility] ED on 12/06/2024.  No abdominal pain.  Exam reassuring. Orders:   CK (Creatine Kinase)  Mood disorder No SI, HI.  Mood stable. No longer taking hydroxyzine , risperidone .  Currently on venlafaxine  75 mg daily and follows with Dr. Chipper.     Onychomycosis Onychomycosis of bilateral toenails, left worse than right. Conservative management as patient declines podiatry referral (due to insurance concerns). Educated on daily foot hygiene: wash and dry feet thoroughly, especially between toes. Keep toenails trimmed straight across and filed to reduce thickness. Monitor for skin breakdown, pain, redness, drainage, or signs of secondary infection and report promptly. Reassess at follow-up or sooner if symptoms worsen.    Encounter for diabetic foot exam (HCC) Onychomycosis of bilateral toenails, left worse than right. Conservative management as  patient declines podiatry referral. Educated on daily foot hygiene: wash and dry feet thoroughly, especially between toes. Keep toenails trimmed straight across and filed to reduce thickness. Monitor for skin breakdown, pain, redness, drainage, or signs of secondary infection and report promptly. Reassess at follow-up or sooner if symptoms worsen.    History of unprotected sex Recent history of unprotected  sex. On Truvada with [employer] [facility]. Screened for STI during his recent visit which was reassuring. Advised on safe sex practice done.  Continue current post-exposure prophylaxis regimen. Follow up with public health department for PrEP discussion after 28-day course.  Coronary artery calcification CTA chest and pelvis from [facility] ED on 12/06/2024 was suspicious for small patent foreman ovale and significant coronary artery calcification.  Patient is asymptomatic today.  He is currently on rosuvastatin  10 mg daily.  Continue.  Recommend cardiology evaluation, patient reached out via phone call after his office visit was over to update on cardiology referral recommendation.  Patient agrees with the plan.  Referral made. Orders:   Ambulatory referral to Cardiology  I personally spent a total of 45 minutes in the care of the patient today including preparing to see the patient, getting/reviewing separately obtained history, performing a medically appropriate exam/evaluation, counseling and educating, placing orders, referring and communicating with other health care professionals, documenting clinical information in the EHR, independently interpreting results, communicating results, and coordinating care.  Return in about 8 weeks (around 02/08/2025) for Chronic follow up .   Luke Shade, MD "

## 2024-12-14 NOTE — Assessment & Plan Note (Signed)
 Onychomycosis of bilateral toenails, left worse than right. Conservative management as patient declines podiatry referral. Educated on daily foot hygiene: wash and dry feet thoroughly, especially between toes. Keep toenails trimmed straight across and filed to reduce thickness. Monitor for skin breakdown, pain, redness, drainage, or signs of secondary infection and report promptly. Reassess at follow-up or sooner if symptoms worsen.

## 2024-12-16 NOTE — Progress Notes (Unsigned)
" °  Cardiology Office Note   Date:  12/16/2024  ID:  Joseph Booth, DOB 1961-04-04, MRN [id number] PCP: Abbey Bruckner, MD  [facility] Providers Cardiologist:  None { Click to update primary MD,subspecialty MD or APP then REFRESH:1}    History of Present Illness Joseph Booth is a 64 y.o. male PMH DM 2, HTN, HLD who presents for further evaluation of coronary artery calcifications and possible PFO.  ***.  Last LDL 62 08/2024.  Relevant CVD History -Coronary artery calcification seen on CT scan 11/2024 at [facility]; noted a possible small PFO -Normal stress echocardiogram 02/2018  ROS: Pt denies any chest discomfort, jaw pain, arm pain, palpitations, syncope, presyncope, orthopnea, PND, or LE edema.  Studies Reviewed I have independently reviewed the patient's ECG, previous cardiac testing, previous medical records, previous blood work.  Physical Exam VS:  There were no vitals taken for this visit.       Wt Readings from Last 3 Encounters:  12/14/24 192 lb 12.8 oz (87.5 kg)  09/22/24 208 lb (94.3 kg)  09/07/24 216 lb 3.2 oz (98.1 kg)    GEN: No acute distress. NECK: No JVD; No carotid bruits. CARDIAC: ***RRR, no murmurs, rubs, gallops. RESPIRATORY:  Clear to auscultation. EXTREMITIES:  Warm and well-perfused. No edema.  ASSESSMENT AND PLAN CAC HLD Patent foramen ovale seen on CT imaging done elsewhere        {Are you ordering a CV Procedure (e.g. stress test, cath, DCCV, TEE, etc)?   Press F2        :[id number]}  Dispo: ***  Signed, Caron Poser, MD  "

## 2024-12-19 ENCOUNTER — Ambulatory Visit

## 2025-02-09 ENCOUNTER — Ambulatory Visit
